# Patient Record
Sex: Female | Born: 1985 | Race: Black or African American | Hispanic: No | Marital: Single | Smoking: Never smoker
Health system: Southern US, Community
[De-identification: ages and names within clinical notes are randomized; demographics above are authoritative.]

## PROBLEM LIST (undated history)

## (undated) ENCOUNTER — Inpatient Hospital Stay: Payer: Self-pay

## (undated) ENCOUNTER — Inpatient Hospital Stay: Admission: RE | Payer: Self-pay | Source: Ambulatory Visit

## (undated) DIAGNOSIS — I10 Essential (primary) hypertension: Secondary | ICD-10-CM

## (undated) DIAGNOSIS — I82409 Acute embolism and thrombosis of unspecified deep veins of unspecified lower extremity: Secondary | ICD-10-CM

## (undated) DIAGNOSIS — B009 Herpesviral infection, unspecified: Secondary | ICD-10-CM

## (undated) DIAGNOSIS — A6000 Herpesviral infection of urogenital system, unspecified: Secondary | ICD-10-CM

## (undated) DIAGNOSIS — O99210 Obesity complicating pregnancy, unspecified trimester: Secondary | ICD-10-CM

## (undated) HISTORY — DX: Acute embolism and thrombosis of unspecified deep veins of unspecified lower extremity: I82.409

---
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
## 2012-05-03 DIAGNOSIS — I10 Essential (primary) hypertension: Secondary | ICD-10-CM

## 2012-05-03 HISTORY — DX: Essential (primary) hypertension: I10

## 2014-05-03 NOTE — L&D Delivery Note (Signed)
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 After cervical ripening with cervadil, pitocin was titrated until adequate contraction pattern was noted. Pt entered the second stage of labor and began to push over an intact perineum.  At 1933 a viable and healthy female "Mary Sherman" was delivered via NSVD in a cephalic position, direct OA. A once-looped nuchal cord was clamped and cut x2 at the perineum before delivery of the fetal body. The body did deliver immediately after and a vigorous infant was placed on the maternal abdomen.  APGAR: 7, 9 and 9 at 1, 5 and 10 minutes respectively. Baby weight not performed at time of dictation.   Placenta status delivered spontaneous promptly after delivery and was intact. Cord: 3 vessels.  Fundus was firm, minimal bleeding. Postpartum pitocin started in the third stage. Anesthesia: None  Episiotomy:  none Lacerations:  1st degree Suture Repair: 3.0 vicryl Est. Blood Loss (mL):  250  Mom to postpartum.  Baby to special care nursery at 10 minutes for grunting and retractions.  Benjaman Kindler 01/15/2015, 7:51 PM

## 2014-06-05 LAB — OB RESULTS CONSOLE RPR: RPR: NONREACTIVE

## 2014-06-05 LAB — OB RESULTS CONSOLE HIV ANTIBODY (ROUTINE TESTING): HIV: NONREACTIVE

## 2014-06-05 LAB — OB RESULTS CONSOLE RUBELLA ANTIBODY, IGM: RUBELLA: IMMUNE

## 2014-06-05 LAB — OB RESULTS CONSOLE HEPATITIS B SURFACE ANTIGEN: HEP B S AG: NEGATIVE

## 2014-06-05 LAB — OB RESULTS CONSOLE VARICELLA ZOSTER ANTIBODY, IGG: Varicella: IMMUNE

## 2014-08-27 ENCOUNTER — Ambulatory Visit
Admit: 2014-08-27 | Disposition: A | Discharge status: Home / Self Care | Payer: Self-pay | Attending: Family Medicine | Admitting: Family Medicine

## 2014-11-11 ENCOUNTER — Other Ambulatory Visit: Payer: Self-pay | Admitting: Family Medicine

## 2014-11-14 ENCOUNTER — Other Ambulatory Visit: Payer: Self-pay | Admitting: Family Medicine

## 2014-11-14 DIAGNOSIS — O4420 Partial placenta previa NOS or without hemorrhage, unspecified trimester: Secondary | ICD-10-CM

## 2014-11-14 DIAGNOSIS — Z3689 Encounter for other specified antenatal screening: Secondary | ICD-10-CM

## 2014-11-15 ENCOUNTER — Inpatient Hospital Stay
Admission: EM | Admit: 2014-11-15 | Discharge: 2014-11-15 | Disposition: A | Discharge status: Home / Self Care | Payer: Medicaid Other | Attending: Obstetrics and Gynecology | Admitting: Obstetrics and Gynecology

## 2014-11-15 ENCOUNTER — Encounter: Payer: Self-pay | Admitting: *Deleted

## 2014-11-15 DIAGNOSIS — Z3A32 32 weeks gestation of pregnancy: Secondary | ICD-10-CM | POA: Insufficient documentation

## 2014-11-15 DIAGNOSIS — N939 Abnormal uterine and vaginal bleeding, unspecified: Secondary | ICD-10-CM | POA: Diagnosis not present

## 2014-11-15 DIAGNOSIS — O4693 Antepartum hemorrhage, unspecified, third trimester: Secondary | ICD-10-CM | POA: Diagnosis not present

## 2014-11-15 HISTORY — DX: Herpesviral infection, unspecified: B00.9

## 2014-11-15 NOTE — OB Triage Note (Signed)
Abdominal pain starting at 1300. Sharp, intermittent pain 9/10. No leaking fluid. Spotting when up to the bathroom. Normal fetal movement reported.

## 2014-11-15 NOTE — H&P (Signed)
Mary Sherman is a 29 y.o. female presenting for vag bleeding while wiping today Maternal Medical History:  Reason for admission: Vaginal bleeding.   Contractions: Onset was 1-2 hours ago.   Frequency: rare.    Fetal activity: Perceived fetal activity is normal.   Last perceived fetal movement was within the past hour.    Prenatal complications: Bleeding.   No cholelithiasis, HIV, PIH, infection, IUGR, nephrolithiasis, oligohydramnios, placental abnormality, polyhydramnios, pre-eclampsia, preterm labor, substance abuse, thrombocytopenia or thrombophilia.     OB History    Gravida Para Term Preterm AB TAB SAB Ectopic Multiple Living   5 1 1  3  1   1      Past Medical History  Diagnosis Date  . HSV (herpes simplex virus) infection   . HSV (herpes simplex virus) infection    Past Surgical History  Procedure Laterality Date  . Cholecystectomy  2007   Family History: family history is not on file. Social History:  reports that she has never smoked. She has never used smokeless tobacco. She reports that she uses illicit drugs (Marijuana). She reports that she does not drink alcohol.   Prenatal Transfer Tool  Maternal Diabetes: No Genetic Screening: Normal Maternal Ultrasounds/Referrals: Normal Fetal Ultrasounds or other Referrals:  None Maternal Substance Abuse:  No Significant Maternal Medications:  None Significant Maternal Lab Results:  None Other Comments:  Low lying placenta  ROS Review of Systems - Negative except vaginal bleeding today   VSSExam Physical Exam  Abd: 1 rare UC noted Ext Gen: no lesions, no edema Vag: Vag exam done and no bleeding seen on exam. Cx: closed/post/?part FHR: Reactive NST with 2 accels 15 x 15 BPM A: IUP at 32 3/7 weeks  Prenatal labs: ABO, Rh:  A pos Antibody:   Neg Rubella:  Immune RPR:   NR HBsAg:   Neg HIV:   not listed GBS:   not done  Assessment/Plan: A: IUP at 32 3/7 weeks 2. Low lying placenta with c/o VB  today P: DC home on pelvic rest. 2. FU as scheduled or return for any problems.  Mary Sherman W 11/15/2014, 6:18 PM

## 2014-11-15 NOTE — Progress Notes (Signed)
Reviewed discharge instructions with patient including signs of labor. Verbalizes understanding. Ambulatory on discharge.

## 2014-11-28 ENCOUNTER — Ambulatory Visit
Admission: RE | Admit: 2014-11-28 | Discharge: 2014-11-28 | Disposition: A | Discharge status: Home / Self Care | Payer: Medicaid Other | Source: Ambulatory Visit | Attending: Family Medicine | Admitting: Family Medicine

## 2014-11-28 DIAGNOSIS — O4440 Low lying placenta NOS or without hemorrhage, unspecified trimester: Secondary | ICD-10-CM

## 2014-11-28 DIAGNOSIS — Z3A33 33 weeks gestation of pregnancy: Secondary | ICD-10-CM | POA: Diagnosis not present

## 2014-11-28 DIAGNOSIS — O99213 Obesity complicating pregnancy, third trimester: Secondary | ICD-10-CM | POA: Insufficient documentation

## 2014-11-28 DIAGNOSIS — Z3689 Encounter for other specified antenatal screening: Secondary | ICD-10-CM

## 2014-11-28 DIAGNOSIS — Z6841 Body Mass Index (BMI) 40.0 and over, adult: Secondary | ICD-10-CM | POA: Diagnosis not present

## 2014-11-28 DIAGNOSIS — F4321 Adjustment disorder with depressed mood: Secondary | ICD-10-CM | POA: Diagnosis present

## 2014-11-28 DIAGNOSIS — O4420 Partial placenta previa NOS or without hemorrhage, unspecified trimester: Secondary | ICD-10-CM

## 2014-11-28 HISTORY — DX: Essential (primary) hypertension: I10

## 2014-11-28 HISTORY — DX: Obesity complicating pregnancy, unspecified trimester: O99.210

## 2014-11-28 LAB — US MFM OB TRANSVAGINAL

## 2014-11-28 LAB — US OB DETAIL + 14 WK

## 2014-12-16 ENCOUNTER — Other Ambulatory Visit: Payer: Self-pay

## 2014-12-16 DIAGNOSIS — E669 Obesity, unspecified: Secondary | ICD-10-CM

## 2014-12-19 ENCOUNTER — Ambulatory Visit
Admission: RE | Admit: 2014-12-19 | Discharge: 2014-12-19 | Disposition: A | Discharge status: Home / Self Care | Payer: Medicaid Other | Source: Ambulatory Visit

## 2014-12-19 ENCOUNTER — Ambulatory Visit
Admission: RE | Admit: 2014-12-19 | Discharge: 2014-12-19 | Disposition: A | Discharge status: Home / Self Care | Payer: Medicaid Other | Source: Ambulatory Visit | Attending: Maternal & Fetal Medicine | Admitting: Maternal & Fetal Medicine

## 2014-12-19 ENCOUNTER — Other Ambulatory Visit: Payer: Self-pay | Admitting: Maternal & Fetal Medicine

## 2014-12-19 DIAGNOSIS — O99213 Obesity complicating pregnancy, third trimester: Secondary | ICD-10-CM | POA: Insufficient documentation

## 2014-12-19 DIAGNOSIS — Z3A36 36 weeks gestation of pregnancy: Secondary | ICD-10-CM | POA: Insufficient documentation

## 2014-12-19 DIAGNOSIS — E669 Obesity, unspecified: Secondary | ICD-10-CM

## 2014-12-19 LAB — OB RESULTS CONSOLE GBS: GBS: NEGATIVE

## 2014-12-19 LAB — OB RESULTS CONSOLE GC/CHLAMYDIA
Chlamydia: NEGATIVE
Gonorrhea: NEGATIVE

## 2014-12-26 ENCOUNTER — Other Ambulatory Visit: Payer: Self-pay

## 2014-12-26 ENCOUNTER — Inpatient Hospital Stay: Admission: RE | Admit: 2014-12-26 | Payer: Medicaid Other | Source: Ambulatory Visit

## 2014-12-26 ENCOUNTER — Ambulatory Visit
Admission: RE | Admit: 2014-12-26 | Discharge: 2014-12-26 | Disposition: A | Discharge status: Home / Self Care | Payer: Medicaid Other | Source: Ambulatory Visit | Attending: Obstetrics and Gynecology | Admitting: Obstetrics and Gynecology

## 2014-12-26 VITALS — BP 114/54 | HR 82 | Temp 98.1°F | Resp 18 | Ht 64.8 in | Wt 324.6 lb

## 2014-12-26 DIAGNOSIS — E669 Obesity, unspecified: Secondary | ICD-10-CM | POA: Insufficient documentation

## 2014-12-26 DIAGNOSIS — O99210 Obesity complicating pregnancy, unspecified trimester: Secondary | ICD-10-CM

## 2014-12-26 DIAGNOSIS — O99213 Obesity complicating pregnancy, third trimester: Secondary | ICD-10-CM | POA: Diagnosis present

## 2014-12-26 DIAGNOSIS — Z3A37 37 weeks gestation of pregnancy: Secondary | ICD-10-CM | POA: Diagnosis not present

## 2014-12-27 ENCOUNTER — Observation Stay
Admission: EM | Admit: 2014-12-27 | Discharge: 2014-12-28 | Disposition: A | Discharge status: Home / Self Care | Payer: Medicaid Other | Attending: Obstetrics and Gynecology | Admitting: Obstetrics and Gynecology

## 2014-12-27 DIAGNOSIS — D573 Sickle-cell trait: Secondary | ICD-10-CM | POA: Insufficient documentation

## 2014-12-27 DIAGNOSIS — O26899 Other specified pregnancy related conditions, unspecified trimester: Secondary | ICD-10-CM

## 2014-12-27 DIAGNOSIS — Z3A37 37 weeks gestation of pregnancy: Secondary | ICD-10-CM | POA: Insufficient documentation

## 2014-12-27 DIAGNOSIS — O10913 Unspecified pre-existing hypertension complicating pregnancy, third trimester: Secondary | ICD-10-CM | POA: Diagnosis not present

## 2014-12-27 DIAGNOSIS — R109 Unspecified abdominal pain: Secondary | ICD-10-CM

## 2014-12-27 DIAGNOSIS — O99213 Obesity complicating pregnancy, third trimester: Secondary | ICD-10-CM | POA: Insufficient documentation

## 2014-12-27 NOTE — H&P (Signed)
[id number] with [place] at [facility] with [place] of LMP of 04/10/2014 & EDD of 01/15/2015 with Morbid Obesity, HSV  Hx on prophylaxis, Essential HTN, Sickle cell trait,  Here tonight to [facility] for c/o "UC's that are becoming uncomfortable", No ROM, VB or decreased FM. Pt is 37 2/7 weeks today. PMH: HTN, HSV, Morbid obesity, Sickle cell trait (Partner has genital HSV) PSH: Cholestetectomy Will: Mom: HTN, DM in mat relatives Social: Left the FOB due to infidelity, +MJ usage, Medication or ETOH usage after LMP  MKL:KJZPHX x 9 Gen: 29 yo black female in NAD, A&O x 3 Heart: S1S2, RRR, No M/R/G Lungs: CTA bilat, No W/R/R. Abd: Gravid,  Extrems:1+/0 FHR: 150, reactive NST with 2 accels 15 x 15 BPM Cx: closed/long/unable to palpate presenting part A: IUP at 37 2/7 weeks P: Urine prot/creat ratio WNL PIH panel WNL P: DC home FU next week for BP check.

## 2014-12-27 NOTE — OB Triage Note (Signed)
Patient complaining of contractions since 1830

## 2014-12-28 LAB — CBC WITH DIFFERENTIAL/PLATELET
Basophils Absolute: 0 10*3/uL (ref 0–0.1)
Basophils Relative: 0 %
EOS PCT: 1 %
Eosinophils Absolute: 0.1 10*3/uL (ref 0–0.7)
HCT: 33.9 % — ABNORMAL LOW (ref 35.0–47.0)
Hemoglobin: 11.4 g/dL — ABNORMAL LOW (ref 12.0–16.0)
LYMPHS ABS: 1.9 10*3/uL (ref 1.0–3.6)
Lymphocytes Relative: 23 %
MCH: 27.9 pg (ref 26.0–34.0)
MCHC: 33.6 g/dL (ref 32.0–36.0)
MCV: 83.2 fL (ref 80.0–100.0)
MONO ABS: 0.5 10*3/uL (ref 0.2–0.9)
Monocytes Relative: 6 %
Neutro Abs: 5.7 10*3/uL (ref 1.4–6.5)
Neutrophils Relative %: 70 %
PLATELETS: 207 10*3/uL (ref 150–440)
RBC: 4.07 MIL/uL (ref 3.80–5.20)
RDW: 15.3 % — AB (ref 11.5–14.5)
WBC: 8.1 10*3/uL (ref 3.6–11.0)

## 2014-12-28 LAB — PROTEIN / CREATININE RATIO, URINE
CREATININE, URINE: 113 mg/dL
Protein Creatinine Ratio: 0.07 mg/mg{Cre} (ref 0.00–0.15)
Total Protein, Urine: 8 mg/dL

## 2014-12-28 LAB — URIC ACID: URIC ACID, SERUM: 5 mg/dL (ref 2.3–6.6)

## 2014-12-28 NOTE — Discharge Instructions (Signed)

## 2015-01-02 ENCOUNTER — Other Ambulatory Visit: Payer: Self-pay

## 2015-01-02 ENCOUNTER — Ambulatory Visit
Admission: RE | Admit: 2015-01-02 | Discharge: 2015-01-02 | Disposition: A | Discharge status: Home / Self Care | Payer: Medicaid Other | Source: Ambulatory Visit | Attending: Internal Medicine | Admitting: Internal Medicine

## 2015-01-02 DIAGNOSIS — O99210 Obesity complicating pregnancy, unspecified trimester: Secondary | ICD-10-CM

## 2015-01-02 DIAGNOSIS — O99213 Obesity complicating pregnancy, third trimester: Secondary | ICD-10-CM | POA: Insufficient documentation

## 2015-01-02 DIAGNOSIS — E669 Obesity, unspecified: Secondary | ICD-10-CM | POA: Diagnosis not present

## 2015-01-02 DIAGNOSIS — Z3A38 38 weeks gestation of pregnancy: Secondary | ICD-10-CM | POA: Diagnosis not present

## 2015-01-02 DIAGNOSIS — Z6841 Body Mass Index (BMI) 40.0 and over, adult: Secondary | ICD-10-CM | POA: Insufficient documentation

## 2015-01-09 ENCOUNTER — Ambulatory Visit
Admission: RE | Admit: 2015-01-09 | Discharge: 2015-01-09 | Disposition: A | Discharge status: Home / Self Care | Payer: Medicaid Other | Source: Ambulatory Visit | Attending: Obstetrics and Gynecology | Admitting: Obstetrics and Gynecology

## 2015-01-09 DIAGNOSIS — O99213 Obesity complicating pregnancy, third trimester: Secondary | ICD-10-CM | POA: Diagnosis not present

## 2015-01-09 DIAGNOSIS — O99210 Obesity complicating pregnancy, unspecified trimester: Secondary | ICD-10-CM

## 2015-01-09 DIAGNOSIS — Z3A39 39 weeks gestation of pregnancy: Secondary | ICD-10-CM | POA: Diagnosis not present

## 2015-01-14 ENCOUNTER — Encounter: Payer: Self-pay | Admitting: Anesthesiology

## 2015-01-14 ENCOUNTER — Inpatient Hospital Stay
Admission: RE | Admit: 2015-01-14 | Discharge: 2015-01-17 | DRG: 774 | Disposition: A | Discharge status: Home / Self Care | Payer: Medicaid Other | Source: Ambulatory Visit | Attending: Obstetrics and Gynecology | Admitting: Obstetrics and Gynecology

## 2015-01-14 ENCOUNTER — Encounter: Payer: Self-pay | Admitting: *Deleted

## 2015-01-14 DIAGNOSIS — Z3A40 40 weeks gestation of pregnancy: Secondary | ICD-10-CM | POA: Diagnosis present

## 2015-01-14 DIAGNOSIS — O99210 Obesity complicating pregnancy, unspecified trimester: Secondary | ICD-10-CM | POA: Diagnosis present

## 2015-01-14 DIAGNOSIS — O99214 Obesity complicating childbirth: Secondary | ICD-10-CM | POA: Diagnosis present

## 2015-01-14 DIAGNOSIS — O9852 Other viral diseases complicating childbirth: Secondary | ICD-10-CM | POA: Diagnosis present

## 2015-01-14 DIAGNOSIS — A6000 Herpesviral infection of urogenital system, unspecified: Secondary | ICD-10-CM | POA: Diagnosis present

## 2015-01-14 DIAGNOSIS — O480 Post-term pregnancy: Secondary | ICD-10-CM | POA: Diagnosis present

## 2015-01-14 DIAGNOSIS — B009 Herpesviral infection, unspecified: Secondary | ICD-10-CM | POA: Diagnosis present

## 2015-01-14 DIAGNOSIS — O133 Gestational [pregnancy-induced] hypertension without significant proteinuria, third trimester: Principal | ICD-10-CM | POA: Diagnosis present

## 2015-01-14 DIAGNOSIS — Z6841 Body Mass Index (BMI) 40.0 and over, adult: Secondary | ICD-10-CM

## 2015-01-14 DIAGNOSIS — O163 Unspecified maternal hypertension, third trimester: Secondary | ICD-10-CM | POA: Diagnosis present

## 2015-01-14 LAB — CBC
HCT: 34.3 % — ABNORMAL LOW (ref 35.0–47.0)
Hemoglobin: 11.6 g/dL — ABNORMAL LOW (ref 12.0–16.0)
MCH: 28.1 pg (ref 26.0–34.0)
MCHC: 33.8 g/dL (ref 32.0–36.0)
MCV: 82.9 fL (ref 80.0–100.0)
Platelets: 216 10*3/uL (ref 150–440)
RBC: 4.14 MIL/uL (ref 3.80–5.20)
RDW: 15.6 % — AB (ref 11.5–14.5)
WBC: 8.2 10*3/uL (ref 3.6–11.0)

## 2015-01-14 LAB — TYPE AND SCREEN
ABO/RH(D): A POS
ANTIBODY SCREEN: NEGATIVE

## 2015-01-14 LAB — ABO/RH: ABO/RH(D): A POS

## 2015-01-14 MED ORDER — LABETALOL HCL 5 MG/ML IV SOLN
10.0000 mg | Freq: Once | INTRAVENOUS | Status: AC
  Administered 2015-01-14: 20 mg via INTRAVENOUS

## 2015-01-14 MED ORDER — OXYTOCIN 40 UNITS IN LACTATED RINGERS INFUSION - SIMPLE MED
INTRAVENOUS | Status: AC
  Filled 2015-01-14: qty 1000

## 2015-01-14 MED ORDER — ACETAMINOPHEN 325 MG PO TABS: 650.0000 mg | ORAL_TABLET | ORAL | Status: DC | PRN

## 2015-01-14 MED ORDER — AMMONIA AROMATIC IN INHA
RESPIRATORY_TRACT | Status: AC
  Filled 2015-01-14: qty 10

## 2015-01-14 MED ORDER — LACTATED RINGERS IV SOLN
INTRAVENOUS | Status: DC
  Administered 2015-01-14 – 2015-01-15 (×3): 1000 mL via INTRAVENOUS
  Administered 2015-01-15: 125 mL/h via INTRAVENOUS
  Administered 2015-01-15: 10:00:00 via INTRAVENOUS

## 2015-01-14 MED ORDER — MORPHINE SULFATE (PF) 10 MG/ML IV SOLN
15.0000 mg | Freq: Once | INTRAVENOUS | Status: AC
  Administered 2015-01-14: 15 mg via INTRAMUSCULAR
  Filled 2015-01-14: qty 2

## 2015-01-14 MED ORDER — MISOPROSTOL 200 MCG PO TABS
ORAL_TABLET | ORAL | Status: AC
  Filled 2015-01-14: qty 4

## 2015-01-14 MED ORDER — DINOPROSTONE 10 MG VA INST
10.0000 mg | VAGINAL_INSERT | Freq: Once | VAGINAL | Status: AC
  Administered 2015-01-14: 10 mg via VAGINAL
  Filled 2015-01-14: qty 1

## 2015-01-14 MED ORDER — OXYTOCIN 10 UNIT/ML IJ SOLN
INTRAMUSCULAR | Status: AC
  Filled 2015-01-14: qty 2

## 2015-01-14 MED ORDER — ONDANSETRON HCL 4 MG/2ML IJ SOLN: 4.0000 mg | Freq: Four times a day (QID) | INTRAMUSCULAR | Status: DC | PRN

## 2015-01-14 MED ORDER — LIDOCAINE HCL (PF) 1 % IJ SOLN
30.0000 mL | INTRAMUSCULAR | Status: DC | PRN
  Filled 2015-01-14: qty 30

## 2015-01-14 MED ORDER — LIDOCAINE HCL (PF) 1 % IJ SOLN
INTRAMUSCULAR | Status: AC
  Filled 2015-01-14: qty 30

## 2015-01-14 MED ORDER — BUTORPHANOL TARTRATE 1 MG/ML IJ SOLN
1.0000 mg | INTRAMUSCULAR | Status: DC | PRN
Start: 2015-01-14 — End: 2015-01-15
  Administered 2015-01-14 – 2015-01-15 (×3): 1 mg via INTRAVENOUS
  Filled 2015-01-14 (×3): qty 1

## 2015-01-14 MED ORDER — LABETALOL HCL 5 MG/ML IV SOLN
INTRAVENOUS | Status: AC
  Administered 2015-01-14: 20 mg via INTRAVENOUS
  Filled 2015-01-14: qty 4

## 2015-01-14 NOTE — H&P (Signed)
Mary Sherman is a 29 y.o. female presenting for induction of labor for gestational htn and obesity .She has a h/o genital HSV and has not had an outbreak for 2 yrs .she has been on acyclovir prophylaxis for 4 weeks according to her  . History OB History    Gravida Para Term Preterm AB TAB SAB Ectopic Multiple Living   5 1 1  3  1   1      Past Medical History  Diagnosis Date  . HSV (herpes simplex virus) infection   . Obesity affecting pregnancy   . Hypertension 2014   Past Surgical History  Procedure Laterality Date  . Cholecystectomy  2007   Family History: family history is not on file. Social History:  reports that she has never smoked. She has never used smokeless tobacco. She reports that she uses illicit drugs (Marijuana). She reports that she does not drink alcohol.   Prenatal Transfer Tool  Maternal Diabetes: No Genetic Screening: Normal Maternal Ultrasounds/Referrals: Normal Fetal Ultrasounds or other Referrals:  None Maternal Substance Abuse:  No Significant Maternal Medications:   acyclovir Significant Maternal Lab Results:  None Other Comments:  None  ROS  Dilation: Fingertip Station: Ballotable Exam by:: tjs Blood pressure 132/58, pulse 77, temperature 98.2 F (36.8 C), temperature source Oral, resp. rate 18, height 5\' 4"  (1.626 m), weight 146.965 kg (324 lb). Exam Physical Exam  VSS: 132/ 58  Lungs CTA  CV RRR  adb soft NT  cx closed / 25%/ -3/ vtx: cervidil placed 0940  External genitalia : no lesions c/w HSV Reassuring fetal monitoring . Irregular ctx   Prenatal labs: ABO, Rh: --/--/A POS (09/13 4825) Antibody: NEG (09/13 0956) Rubella:   RPR:    HBsAg:    HIV:    GBS:   negative GBS  Assessment/Plan: Gestational HTN  Obesity  Cervidil induction    SCHERMERHORN,THOMAS 01/14/2015, 1:47 PM

## 2015-01-14 NOTE — Progress Notes (Addendum)
Patient ID: Mary Sherman, female   DOB: Feb 22, 1986, 29 y.o.   MRN: [id number] CTX q 1-2 min. Pt breathing through ctx. Cervidil pulled out . FHR reassuring cx : 1cm / 40% / -2  A; early latent labor  P: small meal  15 mg morphine sulfate IM  , may repeat in 6 hrs . Reeval in am      BP elevated , probably related to pain  PIH labs ordered  LAbetalol to keep SBP<160 + DBP<100

## 2015-01-15 LAB — RPR: RPR Ser Ql: NONREACTIVE

## 2015-01-15 MED ORDER — SIMETHICONE 80 MG PO CHEW: 80.0000 mg | CHEWABLE_TABLET | ORAL | Status: DC | PRN

## 2015-01-15 MED ORDER — FLEET ENEMA 7-19 GM/118ML RE ENEM: 1.0000 | ENEMA | Freq: Every day | RECTAL | Status: DC | PRN

## 2015-01-15 MED ORDER — DIPHENHYDRAMINE HCL 25 MG PO CAPS: 25.0000 mg | ORAL_CAPSULE | Freq: Four times a day (QID) | ORAL | Status: DC | PRN

## 2015-01-15 MED ORDER — ONDANSETRON HCL 4 MG PO TABS: 4.0000 mg | ORAL_TABLET | ORAL | Status: DC | PRN

## 2015-01-15 MED ORDER — ONDANSETRON HCL 4 MG/2ML IJ SOLN: 4.0000 mg | INTRAMUSCULAR | Status: DC | PRN

## 2015-01-15 MED ORDER — TETANUS-DIPHTH-ACELL PERTUSSIS 5-2.5-18.5 LF-MCG/0.5 IM SUSP: 0.5000 mL | Freq: Once | INTRAMUSCULAR | Status: DC

## 2015-01-15 MED ORDER — SODIUM CHLORIDE 0.9 % IV SOLN: 250.0000 mL | INTRAVENOUS | Status: DC | PRN

## 2015-01-15 MED ORDER — SENNOSIDES-DOCUSATE SODIUM 8.6-50 MG PO TABS
2.0000 | ORAL_TABLET | ORAL | Status: DC
  Administered 2015-01-16 – 2015-01-17 (×2): 2 via ORAL
  Filled 2015-01-15 (×2): qty 2

## 2015-01-15 MED ORDER — OXYTOCIN 40 UNITS IN LACTATED RINGERS INFUSION - SIMPLE MED
1.0000 m[IU]/min | INTRAVENOUS | Status: DC
  Administered 2015-01-15: 1 m[IU]/min via INTRAVENOUS
  Filled 2015-01-15: qty 1000

## 2015-01-15 MED ORDER — MORPHINE SULFATE (PF) 10 MG/ML IV SOLN
15.0000 mg | Freq: Once | INTRAVENOUS | Status: AC
  Administered 2015-01-15: 15 mg via INTRAMUSCULAR
  Filled 2015-01-15: qty 2

## 2015-01-15 MED ORDER — BUTORPHANOL TARTRATE 1 MG/ML IJ SOLN
1.0000 mg | INTRAMUSCULAR | Status: DC | PRN
  Administered 2015-01-15 (×2): 2 mg via INTRAVENOUS
  Filled 2015-01-15 (×2): qty 2

## 2015-01-15 MED ORDER — OXYTOCIN 40 UNITS IN LACTATED RINGERS INFUSION - SIMPLE MED: 62.5000 mL/h | INTRAVENOUS | Status: DC | PRN

## 2015-01-15 MED ORDER — HYDRALAZINE HCL 20 MG/ML IJ SOLN: 10.0000 mg | Freq: Once | INTRAMUSCULAR | Status: DC | PRN

## 2015-01-15 MED ORDER — OXYCODONE-ACETAMINOPHEN 5-325 MG PO TABS: 2.0000 | ORAL_TABLET | ORAL | Status: DC | PRN

## 2015-01-15 MED ORDER — IBUPROFEN 600 MG PO TABS
ORAL_TABLET | ORAL | Status: AC
  Administered 2015-01-15: 600 mg
  Filled 2015-01-15: qty 1

## 2015-01-15 MED ORDER — LABETALOL HCL 5 MG/ML IV SOLN
20.0000 mg | INTRAVENOUS | Status: DC | PRN
Start: 2015-01-15 — End: 2015-01-15

## 2015-01-15 MED ORDER — PRENATAL MULTIVITAMIN CH
1.0000 | ORAL_TABLET | Freq: Every day | ORAL | Status: DC
  Administered 2015-01-16 – 2015-01-17 (×2): 1 via ORAL
  Filled 2015-01-15 (×2): qty 1

## 2015-01-15 MED ORDER — MEASLES, MUMPS & RUBELLA VAC ~~LOC~~ INJ: 0.5000 mL | INJECTION | Freq: Once | SUBCUTANEOUS | Status: DC

## 2015-01-15 MED ORDER — ZOLPIDEM TARTRATE 5 MG PO TABS: 5.0000 mg | ORAL_TABLET | Freq: Every evening | ORAL | Status: DC | PRN

## 2015-01-15 MED ORDER — IBUPROFEN 600 MG PO TABS
600.0000 mg | ORAL_TABLET | Freq: Four times a day (QID) | ORAL | Status: DC
  Administered 2015-01-15 – 2015-01-17 (×4): 600 mg via ORAL
  Filled 2015-01-15 (×5): qty 1

## 2015-01-15 MED ORDER — BENZOCAINE-MENTHOL 20-0.5 % EX AERO: 1.0000 "application " | INHALATION_SPRAY | CUTANEOUS | Status: DC | PRN

## 2015-01-15 MED ORDER — ACETAMINOPHEN 325 MG PO TABS: 650.0000 mg | ORAL_TABLET | ORAL | Status: DC | PRN

## 2015-01-15 MED ORDER — BENZOCAINE-MENTHOL 20-0.5 % EX AERO
INHALATION_SPRAY | CUTANEOUS | Status: AC
  Administered 2015-01-15: 21:00:00
  Filled 2015-01-15: qty 56

## 2015-01-15 MED ORDER — WITCH HAZEL-GLYCERIN EX PADS: 1.0000 "application " | MEDICATED_PAD | CUTANEOUS | Status: DC | PRN

## 2015-01-15 MED ORDER — SODIUM CHLORIDE 0.9 % IJ SOLN: 3.0000 mL | INTRAMUSCULAR | Status: DC | PRN

## 2015-01-15 MED ORDER — DIBUCAINE 1 % RE OINT: 1.0000 "application " | TOPICAL_OINTMENT | RECTAL | Status: DC | PRN

## 2015-01-15 MED ORDER — TERBUTALINE SULFATE 1 MG/ML IJ SOLN: 0.2500 mg | Freq: Once | INTRAMUSCULAR | Status: DC | PRN

## 2015-01-15 MED ORDER — LANOLIN HYDROUS EX OINT: TOPICAL_OINTMENT | CUTANEOUS | Status: DC | PRN

## 2015-01-15 MED ORDER — BISACODYL 10 MG RE SUPP: 10.0000 mg | Freq: Every day | RECTAL | Status: DC | PRN

## 2015-01-15 MED ORDER — OXYCODONE-ACETAMINOPHEN 5-325 MG PO TABS: 1.0000 | ORAL_TABLET | ORAL | Status: DC | PRN

## 2015-01-15 MED ORDER — SODIUM CHLORIDE 0.9 % IJ SOLN
3.0000 mL | Freq: Two times a day (BID) | INTRAMUSCULAR | Status: DC
Start: 2015-01-15 — End: 2015-01-16

## 2015-01-15 NOTE — Progress Notes (Signed)
Patient ID: Mary Sherman, female   DOB: 1985/10/25, 29 y.o.   MRN: [id number]  Day #2 of induction for HTN. Cervix 2/50/high and anterior. Will begin pitocin, as is contracting on monitor q3-5 min.  Monitor BP- labetolol PRN severe range.

## 2015-01-15 NOTE — Plan of Care (Signed)
Dr. Leafy Ro in room for delivery. Patient repositioned; pushing very well. Tight nuchal cord, cut and infant to warmer. Deanna Artis, RN

## 2015-01-15 NOTE — Progress Notes (Signed)
SHARLISA HOLLIFIELD is a 29 y.o. [phone] at [redacted]w[redacted]d admitted for iol for gHTN and obesity.  Subjective: Contracting effectively, feeling urge to push.  Objective: BP 141/96 mmHg  Pulse 71  Temp(Src) 97.7 F (36.5 C) (Oral)  Resp 18  Ht 5\' 4"  (1.626 m)  Wt 146.965 kg (324 lb)  BMI 55.59 kg/m2 I/O last 3 completed shifts: In: 918.8 [I.V.:918.8] Out: -     FHT:  FHR: 145 bpm, variability: moderate,  accelerations:  Present,  decelerations:  Absent UC:   regular, every 3 minutes SVE:   Dilation:  9.5 Effacement (%): 90 Station: 0 Exam by:: gck  Labs: Lab Results  Component Value Date   WBC 8.2 01/14/2015   HGB 11.6* 01/14/2015   HCT 34.3* 01/14/2015   MCV 82.9 01/14/2015   PLT 216 01/14/2015    Assessment / Plan: Induction of labor due to gestational hypertension,  progressing well on pitocin  Labor: Progressing normally Fetal Wellbeing:  Category I Pain Control:  Labor support without medications I/D:  n/a Anticipated MOD:  NSVD   Trial push unable to reduce cervix. Continue labor. Continuous monitoring.  Benjaman Kindler 01/15/2015, 6:48 PM

## 2015-01-15 NOTE — Plan of Care (Signed)
Following IM injection of Morphine, patient sleeping hard, unable to move to readjust [place] to trace FHT's. Multiple attempts to move patient and/or reposition [place]. Deanna Artis, RN

## 2015-01-16 LAB — CBC
HCT: 35.1 % (ref 35.0–47.0)
Hemoglobin: 11.7 g/dL — ABNORMAL LOW (ref 12.0–16.0)
MCH: 27.9 pg (ref 26.0–34.0)
MCHC: 33.4 g/dL (ref 32.0–36.0)
MCV: 83.5 fL (ref 80.0–100.0)
PLATELETS: 208 10*3/uL (ref 150–440)
RBC: 4.2 MIL/uL (ref 3.80–5.20)
RDW: 15.1 % — AB (ref 11.5–14.5)
WBC: 13.9 10*3/uL — ABNORMAL HIGH (ref 3.6–11.0)

## 2015-01-16 NOTE — Progress Notes (Signed)
PPD #1, SVD, female "73"  S:  Reports feeling good, minimal rest overnight             Tolerating po/ No nausea or vomiting             Bleeding is light             Pain controlled withMotrin             Up ad lib / ambulatory / voiding QS  Newborn - bottle feeding    O:               VS: Blood pressure 144/79, pulse 74, temperature 98.2 F (36.8 C), temperature source Oral, resp. rate 21, height 5\' 4"  (1.626 m), weight 146.965 kg (324 lb), SpO2 100 %, unknown if currently breastfeeding.  LABS:              Recent Labs  01/14/15 0958 01/16/15 0634  WBC 8.2 13.9*  HGB 11.6* 11.7*  PLT 216 208               Blood type: --/--/A POS (09/13 0957)  Rubella: Immune (02/03 0000)                     I&O: Intake/Output      09/14 0701 - 09/15 0700 09/15 0701 - 09/16 0700   I.V. (mL/kg)     Total Intake(mL/kg)     Urine (mL/kg/hr) 400 (0.1) 1000 (6.5)   Total Output 400 1000   Net -400 -1000        Urine Occurrence  1 x                 Physical Exam:             Alert and oriented X3  Lungs: Clear and unlabored  Heart: regular rate and rhythm / no mumurs  Abdomen: soft, non-tender, non-distended              Fundus: firm, non-tender, U 1+  Perineum: well-approximated 1st degree lac - healing well, no erythema, no ecchymosis   Lochia: light, bright red, no clots noted  Extremities: +1 dependent edema in lower extremities, no calf pain or tenderness    A: PPD # 1   Doing well - stable status  Morbid Obesity  Chronic HTN - not on meds, BP's stable   P: Routine post partum orders  Encourage rest today   Empty bladder every 2-3 hours   Breast care reviewed for bottle feeding mothers reviewed  Anticipate discharge home tomorrow  Darliss Cheney, CNM

## 2015-01-17 LAB — SURGICAL PATHOLOGY

## 2015-01-17 NOTE — Progress Notes (Signed)
Patient discharged home with infant. Discharge instructions, prescriptions and follow up appointment given to and reviewed with patient. Patient verbalized understanding. Escorted out with infant by auxillary.

## 2015-01-17 NOTE — Discharge Instructions (Signed)
° ° ° ° °  Catheryn Bacon, CNM 01/17/2015 10:48 AM

## 2015-01-17 NOTE — Discharge Summary (Signed)
Obstetric Discharge Summary   Patient ID: Mary Sherman MRN: [id number] DOB/AGE: 27-07-1985 27 y.o.   Date of Admission: 01/14/2015  Date of Discharge: 01/17/15  Admitting Diagnosis: Induction of labor at [redacted]w[redacted]d  Secondary Diagnosis: Gestational hypertension  Mode of Delivery: normal spontaneous vaginal delivery     Discharge Diagnosis: Gestational hypertension   Intrapartum Procedures: IOL: see notes   Post partum procedures: none  Complications: none   Brief Hospital Course  Mary Sherman is a [id number] who had a SVD on 01/15/15;  for further details of this delivery, please refer to the delivery note.  Patient had an uncomplicated postpartum course.  By time of discharge on PPD#2her pain was controlled on oral pain medications; she had appropriate lochia and was ambulating, voiding without difficulty and tolerating regular diet.  She was deemed stable for discharge to home.    .  Patient had an uncomplicated postpartum course.  By time of discharge on PPD#2 her pain was controlled on oral pain medications; she had appropriate lochia and was ambulating, voiding without difficulty, tolerating regular diet and passing flatus.   She was deemed stable for discharge to home.    Labs: CBC Latest Ref Rng 01/16/2015 01/14/2015 12/28/2014  WBC 3.6 - 11.0 K/uL 13.9(H) 8.2 8.1  Hemoglobin 12.0 - 16.0 g/dL 11.7(L) 11.6(L) 11.4(L)  Hematocrit 35.0 - 47.0 % 35.1 34.3(L) 33.9(L)  Platelets 150 - 440 K/uL 208 216 207   A POS  Physical exam:  Blood pressure 148/86, pulse 77, temperature 98.7 F (37.1 C), temperature source Oral, resp. rate 18, height 5\' 4"  (1.626 m), weight 324 lb (146.965 kg), SpO2 100 %, unknown if currently breastfeeding. General: alert and no distress Lochia: appropriate Abdomen: soft, NT Uterine Fundus: firm Extremities: No evidence of DVT seen on physical exam. No lower extremity edema.  Discharge Instructions: Per After Visit Summary. Activity: Advance as  tolerated. Pelvic rest for 6 weeks.  Also refer to After Visit Summary Diet: Regular Medications:   Medication List    STOP taking these medications        acyclovir 400 MG tablet  Commonly known as:  ZOVIRAX     aspirin 81 MG tablet      TAKE these medications        prenatal multivitamin Tabs tablet  Take 1 tablet by mouth daily at 12 noon.       Outpatient follow up:  Postpartum contraception: oral contraceptives (estrogen/progesterone)  Discharged Condition: stable  Discharged to: home   Newborn Data:  Baby Boy named "Jabari"  Disposition:home with mother  Apgars: APGAR (1 MIN): 7   APGAR (5 MINS): 9   APGAR (10 MINS):    Baby Feeding: [place], CNM 01/17/2015

## 2015-03-20 ENCOUNTER — Emergency Department: Payer: Medicaid Other

## 2015-03-20 ENCOUNTER — Emergency Department
Admission: EM | Admit: 2015-03-20 | Discharge: 2015-03-20 | Disposition: A | Discharge status: Home / Self Care | Payer: Medicaid Other | Attending: Emergency Medicine | Admitting: Emergency Medicine

## 2015-03-20 ENCOUNTER — Encounter: Payer: Self-pay | Admitting: Emergency Medicine

## 2015-03-20 DIAGNOSIS — Z79899 Other long term (current) drug therapy: Secondary | ICD-10-CM | POA: Insufficient documentation

## 2015-03-20 DIAGNOSIS — M79604 Pain in right leg: Secondary | ICD-10-CM | POA: Diagnosis present

## 2015-03-20 DIAGNOSIS — I82401 Acute embolism and thrombosis of unspecified deep veins of right lower extremity: Secondary | ICD-10-CM

## 2015-03-20 DIAGNOSIS — I10 Essential (primary) hypertension: Secondary | ICD-10-CM | POA: Diagnosis not present

## 2015-03-20 MED ORDER — TRAMADOL HCL 50 MG PO TABS: 50.0000 mg | ORAL_TABLET | Freq: Four times a day (QID) | ORAL | Status: DC | PRN

## 2015-03-20 MED ORDER — RIVAROXABAN (XARELTO) VTE STARTER PACK (15 & 20 MG): ORAL_TABLET | ORAL | Status: DC

## 2015-03-20 NOTE — ED Provider Notes (Signed)
[facility] Emergency Department Provider Note ____________________________________________  Time seen: Approximately 9:04 AM  I have reviewed the triage vital signs and the nursing notes.   HISTORY  Chief Complaint Leg Pain   HPI Mary Sherman is a 29 y.o. female who presents to the emergency department for evaluation of right calf pain. She denies injury. She denies ever having had similar symptoms. She is a nonsmoker, she does take birth control pills, she denies history of PE or DVT. 2 months postpartum. Not breastfeeding.   Past Medical History  Diagnosis Date  . HSV (herpes simplex virus) infection   . Obesity affecting pregnancy   . Hypertension 2014    Patient Active Problem List   Diagnosis Date Noted  . Labor and delivery indication for care or intervention 01/14/2015  . Abdominal pain in pregnancy 12/27/2014  . Maternal obesity syndrome in third trimester 12/26/2014  . Vaginal bleeding 11/15/2014    Past Surgical History  Procedure Laterality Date  . Cholecystectomy  2007    Current Outpatient Rx  Name  Route  Sig  Dispense  Refill  . chlorothiazide (DIURIL) 250 MG tablet   Oral   Take 250 mg by mouth every morning.         . Prenatal Vit-Fe Fumarate-FA (PRENATAL MULTIVITAMIN) TABS tablet   Oral   Take 1 tablet by mouth daily at 12 noon.         . Rivaroxaban (XARELTO STARTER PACK) 15 & 20 MG TBPK      Take as directed on package: Start with one 15mg  tablet by mouth twice a day with food. On Day 22, switch to one 20mg  tablet once a day with food.   51 each   0   . traMADol (ULTRAM) 50 MG tablet   Oral   Take 1 tablet (50 mg total) by mouth every 6 (six) hours as needed.   9 tablet   0     Allergies Review of patient's allergies indicates no known allergies.  No family history on file.  Social History Social History  Substance Use Topics  . Smoking status: Never Smoker   . Smokeless tobacco: Never Used   . Alcohol Use: No    Review of Systems Constitutional: No recent illness. Eyes: No visual changes. ENT: No sore throat. Cardiovascular: Denies chest pain or palpitations. Respiratory: Denies shortness of breath. Gastrointestinal: No abdominal pain.  Genitourinary: Negative for dysuria. Musculoskeletal: Pain in right calf Skin: Negative for rash. Neurological: Negative for headaches, focal weakness or numbness. 10-point ROS otherwise negative.  ____________________________________________   PHYSICAL EXAM:  VITAL SIGNS: ED Triage Vitals  Enc Vitals Group     BP 03/20/15 0827 114/74 mmHg     Pulse Rate 03/20/15 0827 75     Resp 03/20/15 0827 20     Temp 03/20/15 0827 97.3 F (36.3 C)     Temp Source 03/20/15 0827 Oral     SpO2 03/20/15 0827 97 %     Weight 03/20/15 0827 324 lb (146.965 kg)     Height --      Head Cir --      Peak Flow --      Pain Score 03/20/15 0824 8     Pain Loc --      Pain Edu? --      Excl. in [place]? --     Constitutional: Alert and oriented. Well appearing and in no acute distress. Eyes: Conjunctivae are normal. EOMI. Head: Atraumatic.  Nose: No congestion/rhinnorhea. Neck: No stridor.  Respiratory: Normal respiratory effort.   Musculoskeletal: Pain elicited with palpation of posterior right calf. Homans sign is negative. Neurologic:  Normal speech and language. No gross focal neurologic deficits are appreciated. Speech is normal. No gait instability. Skin:  Skin is warm, dry and intact. Atraumatic. Psychiatric: Mood and affect are normal. Speech and behavior are normal.  ____________________________________________   LABS (all labs ordered are listed, but only abnormal results are displayed)  Labs Reviewed - No data to display ____________________________________________  RADIOLOGY  Probable occlusive thrombus in the right peroneal vein. ____________________________________________   PROCEDURES  Procedure(s) performed:  None   ____________________________________________   INITIAL IMPRESSION / ASSESSMENT AND PLAN / ED COURSE  Pertinent labs & imaging results that were available during my care of the patient were reviewed by me and considered in my medical decision making (see chart for details).  Xarelto started pack and tramadol Rx given. Patient will follow up with Dr. Clide Deutscher on Nov. 21, 2016 as already scheduled. ER return precautions were given. ____________________________________________   FINAL CLINICAL IMPRESSION(S) / ED DIAGNOSES  Final diagnoses:  Thromboembolism of deep veins of lower extremity, right [facility])       Victorino Dike, FNP 03/20/15 1126  Ahmed Prima, MD 03/20/15 [phone]

## 2015-03-20 NOTE — Discharge Instructions (Signed)
Deep Vein Thrombosis °A deep vein thrombosis (DVT) is a blood clot (thrombus) that usually occurs in a deep, larger vein of the lower leg or the pelvis, or in an upper extremity such as the arm. These are dangerous and can lead to serious and even life-threatening complications if the clot travels to the lungs. °A DVT can damage the valves in your leg veins so that instead of flowing upward, the blood pools in the lower leg. This is called post-thrombotic syndrome, and it can result in pain, swelling, discoloration, and sores on the leg. °CAUSES °A DVT is caused by the formation of a blood clot in your leg, pelvis, or arm. Usually, several things contribute to the formation of blood clots. A clot may develop when: °· Your blood flow slows down. °· Your vein becomes damaged in some way. °· You have a condition that makes your blood clot more easily. °RISK FACTORS °A DVT is more likely to develop in: °· People who are older, especially over 60 years of age. °· People who are overweight (obese). °· People who sit or lie still for a long time, such as during long-distance travel (over 4 hours), bed rest, hospitalization, or during recovery from certain medical conditions like a stroke. °· People who do not engage in much physical activity (sedentary lifestyle). °· People who have chronic breathing disorders. °· People who have a personal or family history of blood clots or blood clotting disease. °· People who have peripheral vascular disease (PVD), diabetes, or some types of cancer. °· People who have heart disease, especially if the person had a recent heart attack or has congestive heart failure. °· People who have neurological diseases that affect the legs (leg paresis). °· People who have had a traumatic injury, such as breaking a hip or leg. °· People who have recently had major or lengthy surgery, especially on the hip, knee, or abdomen. °· People who have had a central line placed inside a large vein. °· People  who take medicines that contain the hormone estrogen. These include birth control pills and hormone replacement therapy. °· Pregnancy or during childbirth or the postpartum period. °· Long plane flights (over 8 hours). °SIGNS AND SYMPTOMS °Symptoms of a DVT can include:  °· Swelling of your leg or arm, especially if one side is much worse. °· Warmth and redness of your leg or arm, especially if one side is much worse. °· Pain in your arm or leg. If the clot is in your leg, symptoms may be more noticeable or worse when you stand or walk. °· A feeling of pins and needles, if the clot is in the arm. °The symptoms of a DVT that has traveled to the lungs (pulmonary embolism, PE) usually start suddenly and include: °· Shortness of breath while active or at rest. °· Coughing or coughing up blood or blood-tinged mucus. °· Chest pain that is often worse with deep breaths. °· Rapid or irregular heartbeat. °· Feeling light-headed or dizzy. °· Fainting. °· Feeling anxious. °· Sweating. °There may also be pain and swelling in a leg if that is where the blood clot started. °These symptoms may represent a serious problem that is an emergency. Do not wait to see if the symptoms will go away. Get medical help right away. Call your local emergency services (911 in the [place] Do not drive yourself to the hospital. °DIAGNOSIS °Your health care provider will take a medical history and perform a physical exam. You may also   have other tests, including: °· Blood tests to assess the clotting properties of your blood. °· Imaging tests, such as CT, ultrasound, MRI, X-ray, and other tests to see if you have clots anywhere in your body. °TREATMENT °After a DVT is identified, it can be treated. The type of treatment that you receive depends on many factors, such as the cause of your DVT, your risk for bleeding or developing more clots, and other medical conditions that you have. Sometimes, a combination of treatments is necessary. Treatment  options may be combined and include: °· Monitoring the blood clot with ultrasound. °· Taking medicines by mouth, such as newer blood thinners (anticoagulants), thrombolytics, or warfarin. °· Taking anticoagulant medicine by injection or through an IV tube. °· Wearing compression stockings or using different types of devices. °· Surgery (rare) to remove the blood clot or to place a filter in your abdomen to stop the blood clot from traveling to your lungs. °Treatments for a DVT are often divided into immediate treatment and long-term treatment (up to 3 months after DVT). You can work with your health care provider to choose the treatment program that is best for you. °HOME CARE INSTRUCTIONS °If you are taking a newer oral anticoagulant: °· Take the medicine every single day at the same time each day. °· Understand what foods and drugs interact with this medicine. °· Understand that there are no regular blood tests required when using this medicine. °· Understand the side effects of this medicine, including excessive bruising or bleeding. Ask your health care provider or pharmacist about other possible side effects. °If you are taking warfarin: °· Understand how to take warfarin and know which foods can affect how warfarin works in your body. °· Understand that it is dangerous to take too much or too little warfarin. Too much warfarin increases the risk of bleeding. Too little warfarin continues to allow the risk for blood clots. °· Follow your PT and INR blood testing schedule. The PT and INR results allow your health care provider to adjust your dose of warfarin. It is very important that you have your PT and INR tested as often as told by your health care provider. °· Avoid major changes in your diet, or tell your health care provider before you change your diet. Arrange a visit with a registered dietitian to answer your questions. Many foods, especially foods that are high in vitamin K, can interfere with warfarin  and affect the PT and INR results. Eat a consistent amount of foods that are high in vitamin K, such as: °¨ Spinach, kale, broccoli, cabbage, collard greens, turnip greens, Brussels sprouts, peas, cauliflower, seaweed, and parsley. °¨ Beef liver and pork liver. °¨ Green tea. °¨ Soybean oil. °· Tell your health care provider about any and all medicines, vitamins, and supplements that you take, including aspirin and other over-the-counter anti-inflammatory medicines. Be especially cautious with aspirin and anti-inflammatory medicines. Do not take those before you ask your health care provider if it is safe to do so. This is important because many medicines can interfere with warfarin and affect the PT and INR results. °· Do not start or stop taking any over-the-counter or prescription medicine unless your health care provider or pharmacist tells you to do so. °If you take warfarin, you will also need to do these things: °· Hold pressure over cuts for longer than usual. °· Tell your dentist and other health care providers that you are taking warfarin before you have any procedures in which   bleeding may occur. °· Avoid alcohol or drink very small amounts. Tell your health care provider if you change your alcohol intake. °· Do not use tobacco products, including cigarettes, chewing tobacco, and e-cigarettes. If you need help quitting, ask your health care provider. °· Avoid contact sports. °General Instructions °· Take over-the-counter and prescription medicines only as told by your health care provider. Anticoagulant medicines can have side effects, including easy bruising and difficulty stopping bleeding. If you are prescribed an anticoagulant, you will also need to do these things: °¨ Hold pressure over cuts for longer than usual. °¨ Tell your dentist and other health care providers that you are taking anticoagulants before you have any procedures in which bleeding may occur. °¨ Avoid contact sports. °· Wear a medical  alert bracelet or carry a medical alert card that says you have had a PE. °· Ask your health care provider how soon you can go back to your normal activities. Stay active to prevent new blood clots from forming. °· Make sure to exercise while traveling or when you have been sitting or standing for a long period of time. It is very important to exercise. Exercise your legs by walking or by tightening and relaxing your leg muscles often. Take frequent walks. °· Wear compression stockings as told by your health care provider to help prevent more blood clots from forming. °· Do not use tobacco products, including cigarettes, chewing tobacco, and e-cigarettes. If you need help quitting, ask your health care provider. °· Keep all follow-up appointments with your health care provider. This is important. °PREVENTION °Take these actions to decrease your risk of developing another DVT: °· Exercise regularly. For at least 30 minutes every day, engage in: °¨ Activity that involves moving your arms and legs. °¨ Activity that encourages good blood flow through your body by increasing your heart rate. °· Exercise your arms and legs every hour during long-distance travel (over 4 hours). Drink plenty of water and avoid drinking alcohol while traveling. °· Avoid sitting or lying in bed for long periods of time without moving your legs. °· Maintain a weight that is appropriate for your height. Ask your health care provider what weight is healthy for you. °· If you are a woman who is over 35 years of age, avoid unnecessary use of medicines that contain estrogen. These include birth control pills. °· Do not smoke, especially if you take estrogen medicines. If you need help quitting, ask your health care provider. °If you are hospitalized, prevention measures may include: °· Early walking after surgery, as soon as your health care provider says that it is safe. °· Receiving anticoagulants to prevent blood clots. If you cannot take  anticoagulants, other options may be available, such as wearing compression stockings or using different types of devices. °SEEK IMMEDIATE MEDICAL CARE IF: °· You have new or increased pain, swelling, or redness in an arm or leg. °· You have numbness or tingling in an arm or leg. °· You have shortness of breath while active or at rest. °· You have chest pain. °· You have a rapid or irregular heartbeat. °· You feel light-headed or dizzy. °· You cough up blood. °· You notice blood in your vomit, bowel movement, or urine. °These symptoms may represent a serious problem that is an emergency. Do not wait to see if the symptoms will go away. Get medical help right away. Call your local emergency services (911 in the [place] Do not drive yourself to the hospital. °  °  This information is not intended to replace advice given to you by your health care provider. Make sure you discuss any questions you have with your health care provider. °  °Document Released: 04/19/2005 Document Revised: 01/08/2015 Document Reviewed: 08/14/2014 °Elsevier Interactive Patient Education ©2016 [employer] ° °

## 2015-03-20 NOTE — ED Notes (Signed)
C/o right lower leg pain x 2 days, states pain is worse with ambulation, denies any injury

## 2015-03-20 NOTE — ED Notes (Signed)
Right lower leg pain for 2 days   States pain is mainly at post.calf area. No redness noted or injury

## 2015-04-20 ENCOUNTER — Emergency Department
Admission: EM | Admit: 2015-04-20 | Discharge: 2015-04-20 | Disposition: A | Discharge status: Home / Self Care | Payer: Medicaid Other | Attending: Emergency Medicine | Admitting: Emergency Medicine

## 2015-04-20 ENCOUNTER — Emergency Department: Payer: Medicaid Other

## 2015-04-20 ENCOUNTER — Encounter: Payer: Self-pay | Admitting: Emergency Medicine

## 2015-04-20 DIAGNOSIS — L03011 Cellulitis of right finger: Secondary | ICD-10-CM | POA: Diagnosis not present

## 2015-04-20 DIAGNOSIS — M79644 Pain in right finger(s): Secondary | ICD-10-CM | POA: Diagnosis present

## 2015-04-20 DIAGNOSIS — Z7901 Long term (current) use of anticoagulants: Secondary | ICD-10-CM | POA: Insufficient documentation

## 2015-04-20 DIAGNOSIS — I10 Essential (primary) hypertension: Secondary | ICD-10-CM | POA: Diagnosis not present

## 2015-04-20 DIAGNOSIS — Z79899 Other long term (current) drug therapy: Secondary | ICD-10-CM | POA: Insufficient documentation

## 2015-04-20 MED ORDER — HYDROCODONE-ACETAMINOPHEN 5-325 MG PO TABS: 1.0000 | ORAL_TABLET | ORAL | Status: DC | PRN

## 2015-04-20 MED ORDER — SULFAMETHOXAZOLE-TRIMETHOPRIM 800-160 MG PO TABS: 1.0000 | ORAL_TABLET | Freq: Two times a day (BID) | ORAL | Status: DC

## 2015-04-20 NOTE — ED Notes (Signed)
Pt states she pulled a hang nail about 2 weeks ago and since then her thumb has swollen and turned black. She also states it popped a few days ago and puss came out. pts originally thought it was because she has hx of blood clots she thought she had a blood cloth in that hand but after further speaking with pt she remembered she had a hang nail.

## 2015-04-20 NOTE — Discharge Instructions (Signed)
Paronychia  Paronychia is an infection of the skin. It happens near a fingernail or toenail. It may cause pain and swelling around the nail. Usually, it is not serious and it clears up with treatment. HOME CARE  Soak the fingers or toes in warm water as told by your doctor. You may be told to do this for 20 minutes, 2-3 times a day.  Keep the area dry when you are not soaking it.  Take medicines only as told by your doctor.  If you were given an antibiotic medicine, finish all of it even if you start to feel better.  Keep the affected area clean.  Do not try to drain a fluid-filled bump yourself.  Wear rubber gloves when putting your hands in water.  Wear gloves if your hands might touch cleaners or chemicals.  Follow your doctor's instructions about:  Wound care.  Bandage (dressing) changes and removal. GET HELP IF:  Your symptoms get worse or do not improve.  You have a fever or chills.  You have redness spreading from the affected area.  You have more fluid, blood, or pus coming from the affected area.  Your finger or knuckle is swollen or is hard to move.   This information is not intended to replace advice given to you by your health care provider. Make sure you discuss any questions you have with your health care provider.   Document Released: 04/07/2009 Document Revised: 09/03/2014 Document Reviewed: 03/27/2014 Elsevier Interactive Patient Education 2016 [place] and warm water for approximately 15-20 minutes 3-4 times a day. Begin antibiotic twice a day for 10 days. Follow-up with your doctor if any continued problems. Return to the emergency room if any severe worsening of your thumb or urgent concerns.

## 2015-04-20 NOTE — ED Provider Notes (Signed)
[facility] Emergency Department Provider Note  ____________________________________________  Time seen: Approximately 5:11 PM  I have reviewed the triage vital signs and the nursing notes.   HISTORY  Chief Complaint No chief complaint on file.  HPI Mary Sherman is a 29 y.o. female is here with complaint of right thumb pain and swelling. Patient states that it began opening up when she pressed on it 2 days ago and pus came out. Patient recalls pulling a hangnail off work from approximately 2 weeks ago before she began having symptoms. She denies any fever or chills. She denies any previous injury to her thumb. She states that her thumb is very tender and swollen today.Currently she rates her pain as a 10 out of 10. Patient has her infant child with her today and states she is not breast-feeding.   Past Medical History  Diagnosis Date  . HSV (herpes simplex virus) infection   . Obesity affecting pregnancy   . Hypertension 2014    Patient Active Problem List   Diagnosis Date Noted  . Labor and delivery indication for care or intervention 01/14/2015  . Abdominal pain in pregnancy 12/27/2014  . Maternal obesity syndrome in third trimester 12/26/2014  . Vaginal bleeding 11/15/2014    Past Surgical History  Procedure Laterality Date  . Cholecystectomy  2007    Current Outpatient Rx  Name  Route  Sig  Dispense  Refill  . chlorothiazide (DIURIL) 250 MG tablet   Oral   Take 250 mg by mouth every morning.         Marland Kitchen HYDROcodone-acetaminophen (NORCO/VICODIN) 5-325 MG tablet   Oral   Take 1 tablet by mouth every 4 (four) hours as needed for moderate pain.   20 tablet   0   . Prenatal Vit-Fe Fumarate-FA (PRENATAL MULTIVITAMIN) TABS tablet   Oral   Take 1 tablet by mouth daily at 12 noon.         . Rivaroxaban (XARELTO STARTER PACK) 15 & 20 MG TBPK      Take as directed on package: Start with one 15mg  tablet by mouth twice a day with food. On  Day 22, switch to one 20mg  tablet once a day with food.   51 each   0   . sulfamethoxazole-trimethoprim (BACTRIM DS,SEPTRA DS) 800-160 MG tablet   Oral   Take 1 tablet by mouth 2 (two) times daily.   20 tablet   0     Allergies Review of patient's allergies indicates no known allergies.  Family History  Problem Relation Age of Onset  . Family history unknown: Yes    Social History Social History  Substance Use Topics  . Smoking status: Never Smoker   . Smokeless tobacco: Never Used  . Alcohol Use: No    Review of Systems Constitutional: No fever/chills Cardiovascular: Denies chest pain. Respiratory: Denies shortness of breath. Gastrointestinal:   No nausea, no vomiting. Musculoskeletal: Negative for back pain. Positive for right thumb pain. Skin: Negative for rash. Positive for infection right thumb. Neurological: Negative for headaches, focal weakness or numbness.  10-point ROS otherwise negative.  ____________________________________________   PHYSICAL EXAM:  VITAL SIGNS: ED Triage Vitals  Enc Vitals Group     BP --      Pulse --      Resp --      Temp --      Temp src --      SpO2 --      Weight --  Height --      Head Cir --      Peak Flow --      Pain Score --      Pain Loc --      Pain Edu? --      Excl. in [place]? --     Constitutional: Alert and oriented. Well appearing and in no acute distress. Eyes: Conjunctivae are normal. PERRL. EOMI. Head: Atraumatic. Nose: No congestion/rhinnorhea. Neck: No stridor.   Cardiovascular: Normal rate, regular rhythm. Grossly normal heart sounds.  Good peripheral circulation. Respiratory: Normal respiratory effort.  No retractions. Lungs CTAB. Gastrointestinal:  No distention.  Musculoskeletal: Moves upper and lower extremities without any difficulty. There is some minimal restriction of the right palm secondary to pain and swelling. Neurologic:  Normal speech and language. No gross focal neurologic  deficits are appreciated. No gait instability. Skin:  Skin is warm, dry and intact. Right thumb dorsum at the base of the cuticle moderate swelling and tenderness present. There is a fluctuant area present. No expanding cellulitis was noted. Motor sensory function intact. Psychiatric: Mood and affect are normal. Speech and behavior are normal.  ____________________________________________   LABS (all labs ordered are listed, but only abnormal results are displayed)  Labs Reviewed - No data to display RADIOLOGY  Right thumb x-ray per radiologist no signs of osteomyelitis. ____________________________________________   PROCEDURES  Procedure(s) performed: None  Critical Care performed: No  ____________________________________________   INITIAL IMPRESSION / ASSESSMENT AND PLAN / ED COURSE  Pertinent labs & imaging results that were available during my care of the patient were reviewed by me and considered in my medical decision making (see chart for details).  She was started on Septra DS twice a day for 10 days. Patient is to do warm water from moist heat soaks to the right thumb. She is return to the emergency room if any severe worsening of her symptoms or fever and chills. ____________________________________________   FINAL CLINICAL IMPRESSION(S) / ED DIAGNOSES  Final diagnoses:  Paronychia of thumb, right      Johnn Hai, PA-C 04/20/15 1846  Harvest Dark, MD 04/20/15 2135

## 2015-09-04 ENCOUNTER — Encounter: Payer: Self-pay | Admitting: Oncology

## 2015-09-04 ENCOUNTER — Inpatient Hospital Stay: Payer: Medicaid Other | Attending: Oncology | Admitting: Oncology

## 2015-09-04 VITALS — BP 123/79 | HR 88 | Temp 98.3°F | Resp 18

## 2015-09-04 DIAGNOSIS — R5381 Other malaise: Secondary | ICD-10-CM | POA: Insufficient documentation

## 2015-09-04 DIAGNOSIS — R531 Weakness: Secondary | ICD-10-CM

## 2015-09-04 DIAGNOSIS — Z7901 Long term (current) use of anticoagulants: Secondary | ICD-10-CM | POA: Diagnosis not present

## 2015-09-04 DIAGNOSIS — Z79899 Other long term (current) drug therapy: Secondary | ICD-10-CM | POA: Diagnosis not present

## 2015-09-04 DIAGNOSIS — R5383 Other fatigue: Secondary | ICD-10-CM | POA: Diagnosis not present

## 2015-09-04 DIAGNOSIS — O2230 Deep phlebothrombosis in pregnancy, unspecified trimester: Secondary | ICD-10-CM

## 2015-09-04 DIAGNOSIS — Z86718 Personal history of other venous thrombosis and embolism: Secondary | ICD-10-CM | POA: Insufficient documentation

## 2015-09-04 DIAGNOSIS — I10 Essential (primary) hypertension: Secondary | ICD-10-CM | POA: Diagnosis not present

## 2015-09-04 DIAGNOSIS — M549 Dorsalgia, unspecified: Secondary | ICD-10-CM | POA: Diagnosis not present

## 2015-09-04 DIAGNOSIS — M2550 Pain in unspecified joint: Secondary | ICD-10-CM | POA: Diagnosis not present

## 2015-09-04 NOTE — Progress Notes (Signed)
Patient had DVT in 03/2015 which was 6 weeks postpartum so she initiated Xarelto.  She is here today to discuss how long to continue the Xarelto.

## 2015-09-07 NOTE — Progress Notes (Signed)
[facility]  Telephone:([phone]) [phone] Fax:([phone]) [phone]  ID: Mary Sherman OB: 04-09-1986  MR#: [id number]  [id number]  Patient Care Team: Donnie Coffin, MD as PCP - General (Family Medicine)  CHIEF COMPLAINT:  Chief Complaint  Patient presents with  . New Evaluation    INTERVAL HISTORY: Patient is a morbidly obese 30 year old female who was noted to have a DVT low the knee proximally 6 weeks postpartum in November 2016. She was initiated on Xarelto at that time and has been referred to clinic to determine the length of anticoagulation. Currently, she feels significantly weakness and fatigue as well as bilateral leg and knee pain which she attributes to standing at her job. She has no neurologic complaints. She denies any easy bleeding or bruising. She denies any chest pain or shortness of breath. She has a good appetite and denies weight loss. She denies any nausea, vomiting, constipation, or diarrhea. She has no urinary complaints. Patient otherwise feels well and offers no further specific complaints.  REVIEW OF SYSTEMS:   Review of Systems  Constitutional: Positive for malaise/fatigue. Negative for fever and weight loss.  Respiratory: Negative.  Negative for hemoptysis and shortness of breath.   Cardiovascular: Negative.  Negative for chest pain and leg swelling.  Gastrointestinal: Negative.  Negative for blood in stool and melena.  Genitourinary: Negative.   Musculoskeletal: Positive for back pain and joint pain.  Neurological: Positive for weakness.  Endo/Heme/Allergies: Does not bruise/bleed easily.  Psychiatric/Behavioral: Negative.     As per HPI. Otherwise, a complete review of systems is negatve.  PAST MEDICAL HISTORY: Past Medical History  Diagnosis Date  . HSV (herpes simplex virus) infection   . Obesity affecting pregnancy   . Hypertension 2014  . DVT (deep venous thrombosis) ([place])     PAST SURGICAL HISTORY: Past Surgical History    Procedure Laterality Date  . Cholecystectomy  2007    FAMILY HISTORY Family History  Problem Relation Age of Onset  . Family history unknown: Yes       ADVANCED DIRECTIVES:    HEALTH MAINTENANCE: Social History  Substance Use Topics  . Smoking status: Never Smoker   . Smokeless tobacco: Never Used  . Alcohol Use: No     Colonoscopy:  PAP:  Bone density:  Lipid panel:  No Known Allergies  Current Outpatient Prescriptions  Medication Sig Dispense Refill  . chlorothiazide (DIURIL) 250 MG tablet Take 250 mg by mouth every morning. Reported on 09/04/2015    . rivaroxaban (XARELTO) 20 MG TABS tablet Take 20 mg by mouth daily with supper.     No current facility-administered medications for this visit.    OBJECTIVE: Filed Vitals:   09/04/15 1524  BP: 123/79  Pulse: 88  Temp: 98.3 F (36.8 C)  Resp: 18     There is no weight on file to calculate BMI.    ECOG FS:1 - Symptomatic but completely ambulatory  General: Morbidly obese, sitting in a wheelchair. Eyes: Pink conjunctiva, anicteric sclera. HEENT: Normocephalic, moist mucous membranes, clear oropharnyx. Lungs: Clear to auscultation bilaterally. Heart: Regular rate and rhythm. No rubs, murmurs, or gallops. Abdomen: Soft, nontender, nondistended. No organomegaly noted, normoactive bowel sounds. Musculoskeletal: No edema, cyanosis, or clubbing. Neuro: Alert, answering all questions appropriately. Cranial nerves grossly intact. Skin: No rashes or petechiae noted. Psych: Normal affect.   LAB RESULTS:  No results found for: NA, K, CL, CO2, GLUCOSE, BUN, CREATININE, CALCIUM, PROT, ALBUMIN, AST, ALT, ALKPHOS, BILITOT, GFRNONAA, GFRAA  Lab Results  Component Value Date   WBC 13.9* 01/16/2015   NEUTROABS 5.7 12/28/2014   HGB 11.7* 01/16/2015   HCT 35.1 01/16/2015   MCV 83.5 01/16/2015   PLT 208 01/16/2015     STUDIES: No results found.  ASSESSMENT: Right calf DVT.  PLAN:    1. Right calf DVT:  Typically blood clots below the knee did not require treatment. At most, 3 months of anticoagulation is adequate. Patient does not require any additional Xarelto and has been instructed to discontinue. Because patient had multiple transient risk factors including being postpartum and extended travel, a hypercoagulable workup is not necessary. Her morbid obesity also puts her at increased risk.  Patient reports that she travels back and forth to [place] frequently to visit her sister and she was educated on the risks of developing a second DVT. Patient also states she might be pregnant again. If her pregnancy tests is positive, she does not require anticoagulation prophylactically but would recommend Lovenox if she develops a second DVT. No further follow-up has been scheduled.  Patient expressed understanding and was in agreement with this plan. She also understands that She can call clinic at any time with any questions, concerns, or complaints.    Lloyd Huger, MD   09/07/2015 10:01 AM

## 2016-04-23 IMAGING — US US EXTREM LOW VENOUS*R*
1 series · 13 of 24 positions shown · non-contrast
Comparison: None.

CLINICAL DATA: Acute nontraumatic right calf pain for 2 days.



[Series 1: us extrem low venous*right* · 0.08mm/px · 13 of 36 slices shown]
[im 1/36]
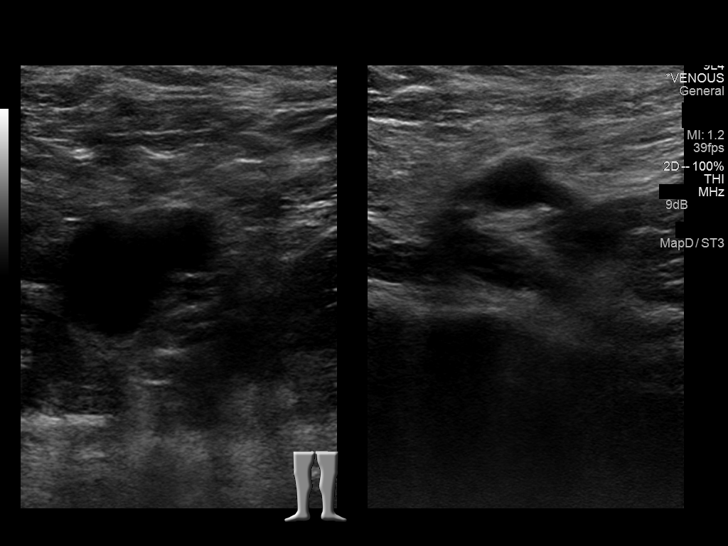
[im 4/36]
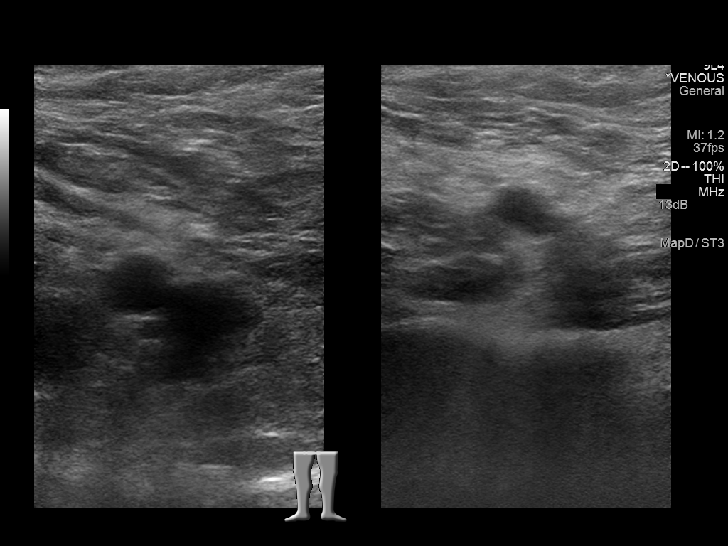
[im 7/36]
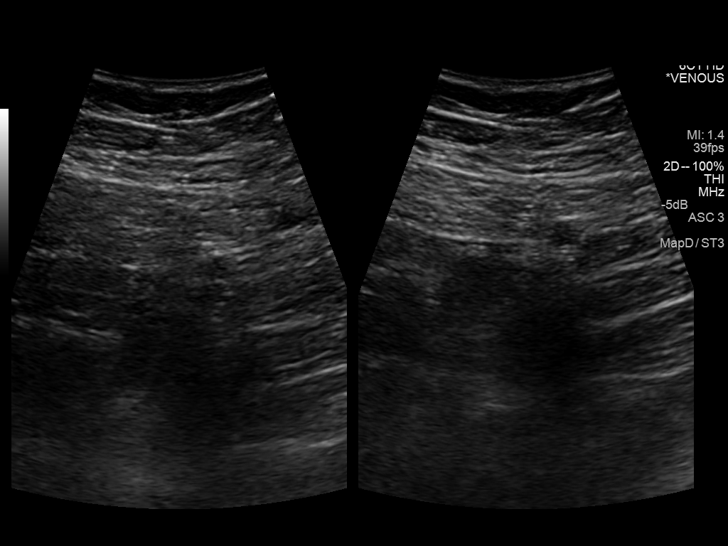
[im 10/36]
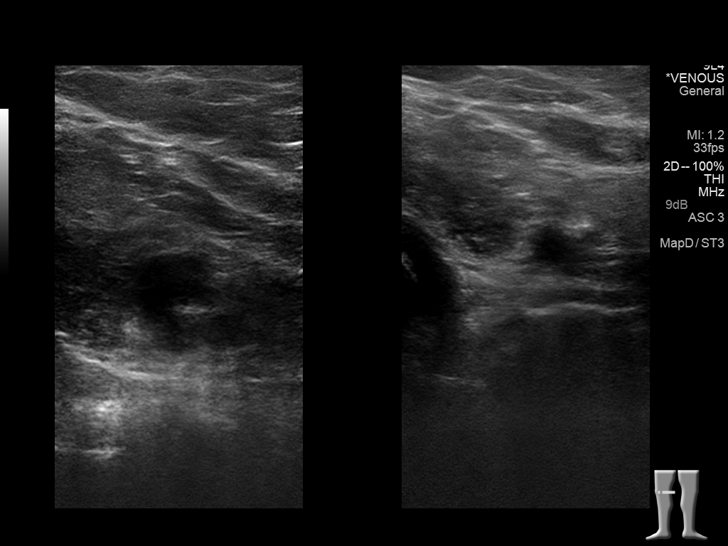
[im 13/36]
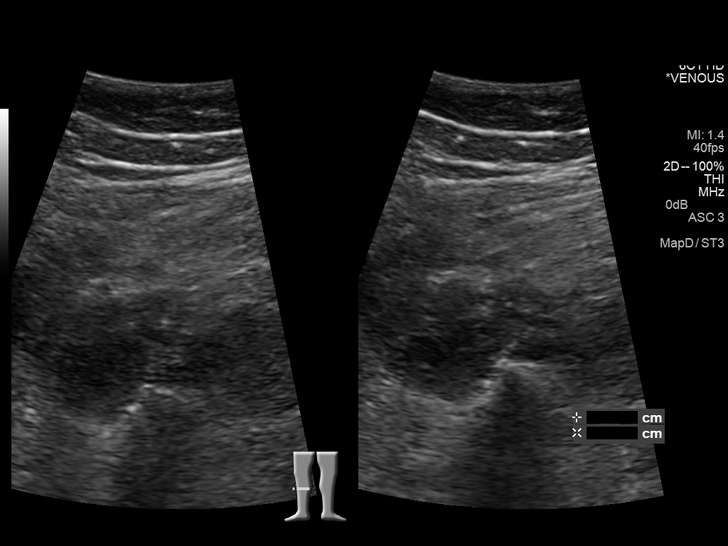
[im 16/36]
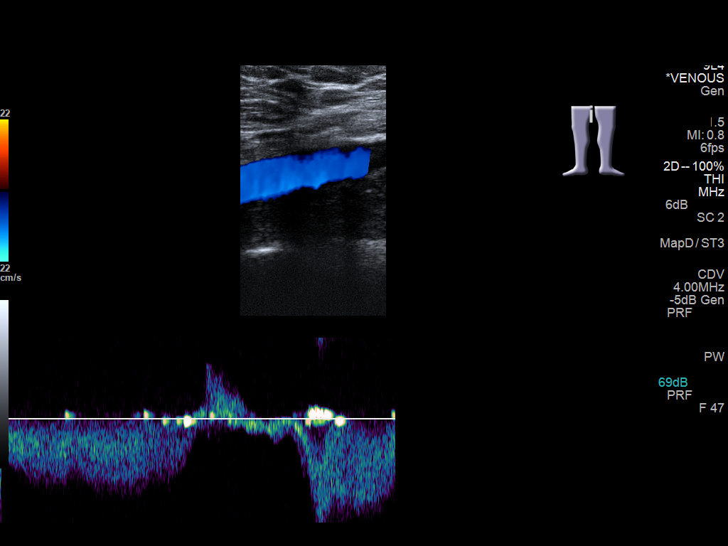
[im 19/36]
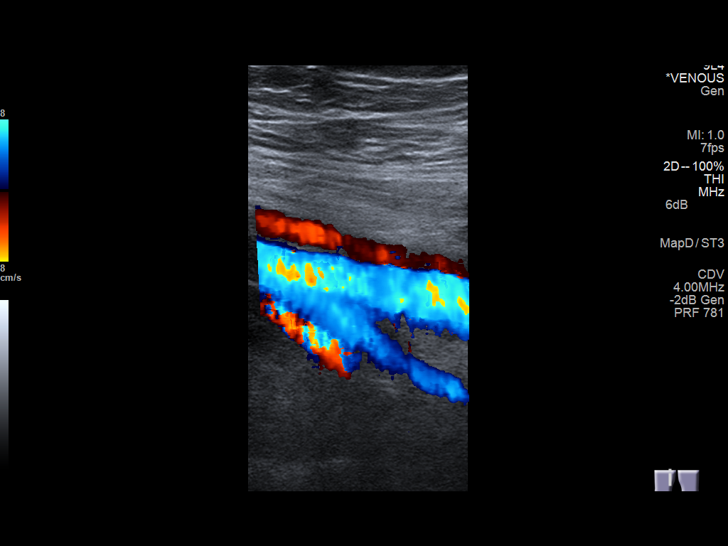
[im 20/36]
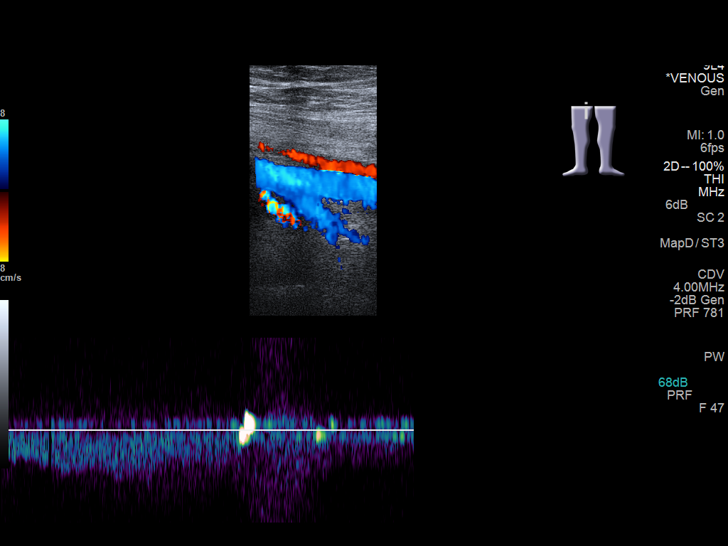
[im 23/36]
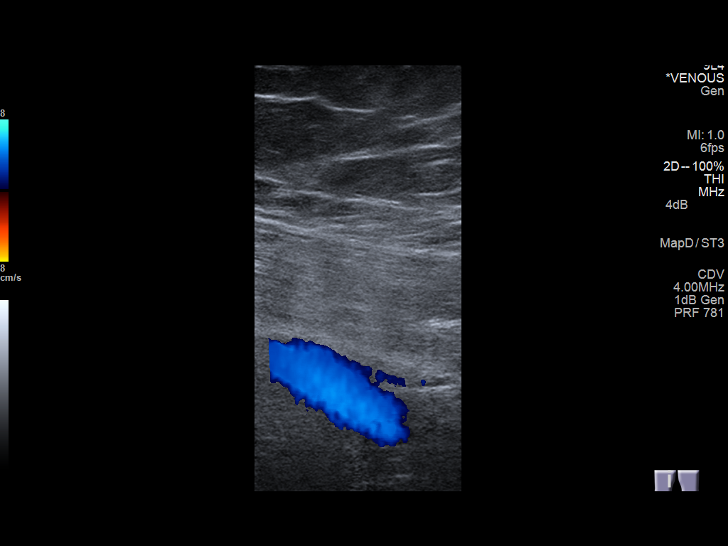
[im 26/36]
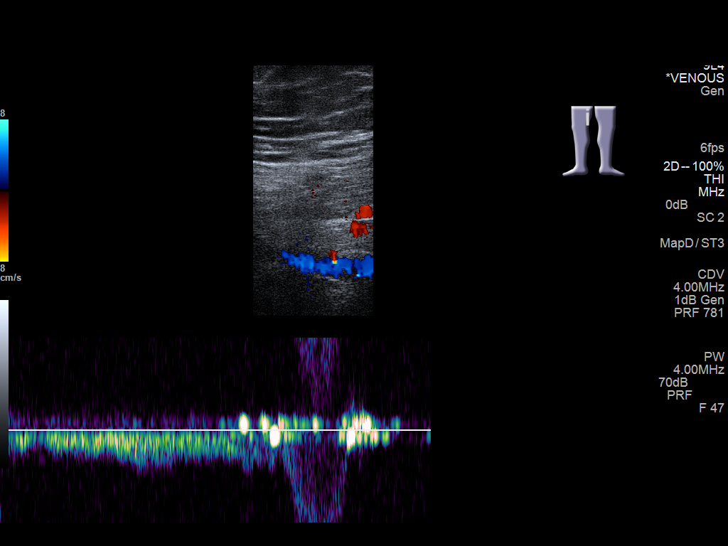
[im 29/36]
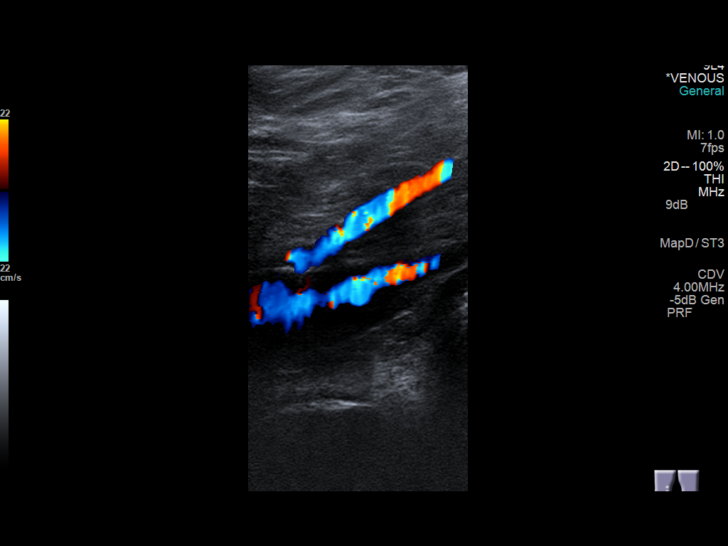
[im 32/36]
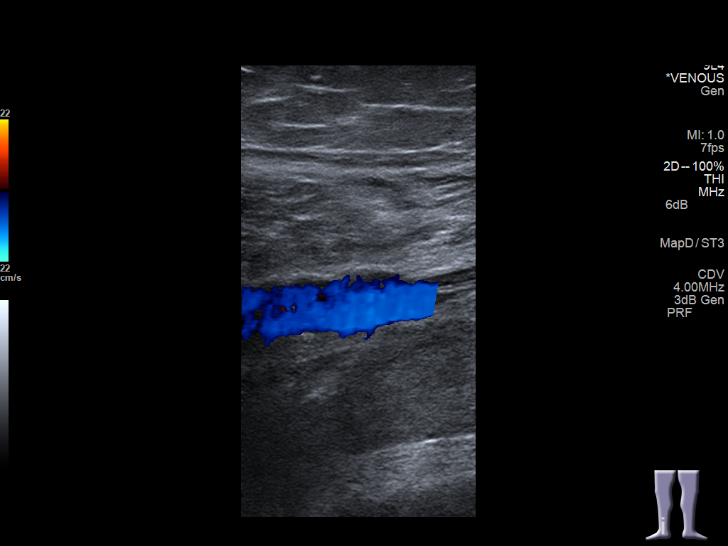
[im 36/36]
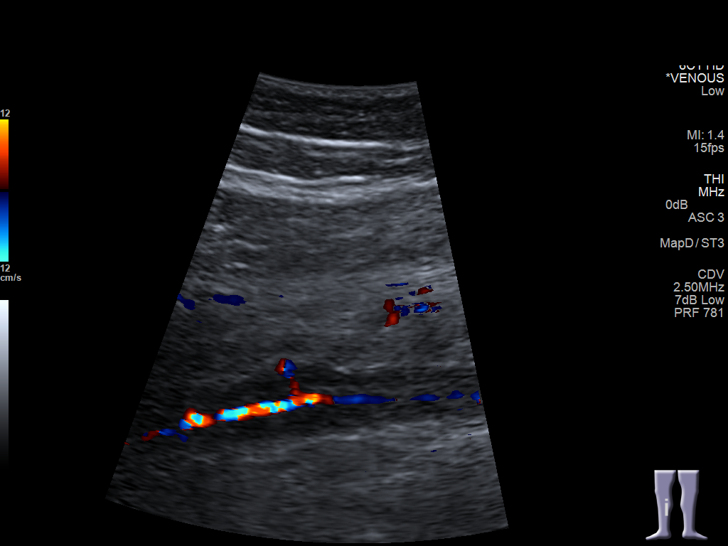

[13 of 24 positions shown; findings below may reference images not displayed]

FINDINGS: Contralateral Common Femoral Vein: Respiratory phasicity is normal
and symmetric with the symptomatic side. No evidence of thrombus.
Normal compressibility.

Common Femoral Vein: No evidence of thrombus. Normal
compressibility, respiratory phasicity and response to augmentation.

Saphenofemoral Junction: No evidence of thrombus. Normal
compressibility and flow on color Doppler imaging.

Profunda Femoral Vein: No evidence of thrombus. Normal
compressibility and flow on color Doppler imaging.

Femoral Vein: No evidence of thrombus. Normal compressibility,
respiratory phasicity and response to augmentation.

Popliteal Vein: No evidence of thrombus. Normal compressibility,
respiratory phasicity and response to augmentation.

Calf Veins: No abnormality seen in the posterior tibial vein.
Noncompressibility of the right peritoneal vein is noted without
flow consistent with occlusive thrombus.

Superficial Great Saphenous Vein: No evidence of thrombus. Normal
compressibility and flow on color Doppler imaging.

Venous Reflux:  None.

Other Findings:  None.
IMPRESSION: Probable occlusive thrombus seen in right peroneal vein.

## 2016-07-16 ENCOUNTER — Other Ambulatory Visit: Payer: Self-pay | Admitting: Family Medicine

## 2016-07-22 ENCOUNTER — Other Ambulatory Visit: Payer: Self-pay | Admitting: Family Medicine

## 2016-07-22 DIAGNOSIS — Z369 Encounter for antenatal screening, unspecified: Secondary | ICD-10-CM

## 2016-08-05 ENCOUNTER — Emergency Department
Admission: EM | Admit: 2016-08-05 | Discharge: 2016-08-05 | Disposition: A | Discharge status: Home / Self Care | Payer: Medicaid Other | Attending: Emergency Medicine | Admitting: Emergency Medicine

## 2016-08-05 ENCOUNTER — Emergency Department: Payer: Medicaid Other

## 2016-08-05 ENCOUNTER — Encounter: Payer: Self-pay | Admitting: Emergency Medicine

## 2016-08-05 DIAGNOSIS — I10 Essential (primary) hypertension: Secondary | ICD-10-CM | POA: Insufficient documentation

## 2016-08-05 DIAGNOSIS — Z79899 Other long term (current) drug therapy: Secondary | ICD-10-CM | POA: Diagnosis not present

## 2016-08-05 DIAGNOSIS — Z3A10 10 weeks gestation of pregnancy: Secondary | ICD-10-CM | POA: Insufficient documentation

## 2016-08-05 DIAGNOSIS — O210 Mild hyperemesis gravidarum: Secondary | ICD-10-CM | POA: Insufficient documentation

## 2016-08-05 DIAGNOSIS — O219 Vomiting of pregnancy, unspecified: Secondary | ICD-10-CM | POA: Diagnosis present

## 2016-08-05 LAB — HCG, QUANTITATIVE, PREGNANCY: hCG, Beta Chain, Quant, S: 193709 m[IU]/mL — ABNORMAL HIGH (ref <5)

## 2016-08-05 LAB — URINALYSIS, COMPLETE (UACMP) WITH MICROSCOPIC
BILIRUBIN URINE: NEGATIVE
Bacteria, UA: NONE SEEN
Glucose, UA: NEGATIVE mg/dL
Hgb urine dipstick: NEGATIVE
KETONES UR: 5 mg/dL — AB
LEUKOCYTES UA: NEGATIVE
Nitrite: NEGATIVE
PROTEIN: NEGATIVE mg/dL
RBC / HPF: NONE SEEN RBC/hpf (ref 0–5)
Specific Gravity, Urine: 1.005 (ref 1.005–1.030)
pH: 7 (ref 5.0–8.0)

## 2016-08-05 LAB — CBC
HCT: 34.8 % — ABNORMAL LOW (ref 35.0–47.0)
HEMOGLOBIN: 12 g/dL (ref 12.0–16.0)
MCH: 28.7 pg (ref 26.0–34.0)
MCHC: 34.5 g/dL (ref 32.0–36.0)
MCV: 83.2 fL (ref 80.0–100.0)
Platelets: 245 10*3/uL (ref 150–440)
RBC: 4.18 MIL/uL (ref 3.80–5.20)
RDW: 14.6 % — ABNORMAL HIGH (ref 11.5–14.5)
WBC: 9.3 10*3/uL (ref 3.6–11.0)

## 2016-08-05 LAB — COMPREHENSIVE METABOLIC PANEL
ALK PHOS: 44 U/L (ref 38–126)
ALT: 84 U/L — AB (ref 14–54)
AST: 36 U/L (ref 15–41)
Albumin: 3 g/dL — ABNORMAL LOW (ref 3.5–5.0)
Anion gap: 5 (ref 5–15)
BUN: 7 mg/dL (ref 6–20)
CHLORIDE: 105 mmol/L (ref 101–111)
CO2: 26 mmol/L (ref 22–32)
CREATININE: 0.79 mg/dL (ref 0.44–1.00)
Calcium: 8.8 mg/dL — ABNORMAL LOW (ref 8.9–10.3)
GFR calc Af Amer: >60 mL/min (ref >60)
GFR calc non Af Amer: >60 mL/min (ref >60)
Glucose, Bld: 88 mg/dL (ref 65–99)
Potassium: 3.4 mmol/L — ABNORMAL LOW (ref 3.5–5.1)
SODIUM: 136 mmol/L (ref 135–145)
Total Bilirubin: 0.5 mg/dL (ref 0.3–1.2)
Total Protein: 6.7 g/dL (ref 6.5–8.1)

## 2016-08-05 MED ORDER — ONDANSETRON HCL 4 MG/2ML IJ SOLN
INTRAMUSCULAR | Status: AC
  Filled 2016-08-05: qty 2

## 2016-08-05 MED ORDER — SODIUM CHLORIDE 0.9 % IV BOLUS (SEPSIS)
1000.0000 mL | Freq: Once | INTRAVENOUS | Status: AC
  Administered 2016-08-05: 1000 mL via INTRAVENOUS

## 2016-08-05 MED ORDER — METOCLOPRAMIDE HCL 5 MG/ML IJ SOLN
10.0000 mg | Freq: Once | INTRAMUSCULAR | Status: AC
  Administered 2016-08-05: 10 mg via INTRAVENOUS
  Filled 2016-08-05: qty 2

## 2016-08-05 MED ORDER — PROMETHAZINE HCL 25 MG/ML IJ SOLN
12.5000 mg | Freq: Once | INTRAMUSCULAR | Status: AC
  Administered 2016-08-05: 12.5 mg via INTRAVENOUS
  Filled 2016-08-05: qty 1

## 2016-08-05 MED ORDER — METOCLOPRAMIDE HCL 10 MG PO TABS: 10.0000 mg | ORAL_TABLET | Freq: Three times a day (TID) | ORAL | 0 refills | Status: DC | PRN

## 2016-08-05 NOTE — ED Notes (Signed)
Pt given ginger ale. Will continue to monitor to see how pt tolerates.

## 2016-08-05 NOTE — ED Notes (Signed)
Patient transported to Ultrasound 

## 2016-08-05 NOTE — ED Triage Notes (Addendum)
Pt presents to ED with c/o vomiting X3 Wednesday. Pt states the last time she vomited (around 2300) she began to develop chest pain. Pain and nausea have continued. Pt alert and appears anxious at this time. Currently 10w 6d pregnant.

## 2016-08-05 NOTE — ED Notes (Signed)
Pt tolerated ginger ale without difficulty, no emesis at this time.

## 2016-08-05 NOTE — ED Notes (Signed)
Pt reports she is hungry and thirsty, MD aware. Waiting to see how pt tolerates nausea medicine.

## 2016-08-05 NOTE — ED Provider Notes (Signed)
[facility] Emergency Department Provider Note   First MD Initiated Contact with Patient 08/05/16 [phone]     (approximate)  I have reviewed the triage vital signs and the nursing notes.   HISTORY  Chief Complaint Emesis; Dehydration; and Chest Pain   HPI KYNDALL AMERO is a 31 y.o. female with history of hypertension G5 P2 (1 miscarriage one elective abortion) 10 weeks 6 days pregnant presents to the emergency department with vomiting 4 weeks with acute worsening over the past day. Patient states inability to tolerate by mouth liquid or solid. Patient states last episode of emesis was at 11:00 PM. Of note patient states that she had burning chest discomfort while vomiting which is since resolved. Patient denies any shortness of breath. Patient states hyperemesis unrelieved with anti-medics prescribed by OB/GYN.   Past Medical History:  Diagnosis Date  . DVT (deep venous thrombosis) ([place])   . HSV (herpes simplex virus) infection   . Hypertension 2014  . Obesity affecting pregnancy     Patient Active Problem List   Diagnosis Date Noted  . Labor and delivery indication for care or intervention 01/14/2015  . Abdominal pain in pregnancy 12/27/2014  . Maternal obesity syndrome in third trimester 12/26/2014  . Vaginal bleeding 11/15/2014    Past Surgical History:  Procedure Laterality Date  . CHOLECYSTECTOMY  2007    Prior to Admission medications   Medication Sig Start Date End Date Taking? Authorizing Provider  chlorothiazide (DIURIL) 250 MG tablet Take 250 mg by mouth every morning. Reported on 09/04/2015    Historical Provider, MD  rivaroxaban (XARELTO) 20 MG TABS tablet Take 20 mg by mouth daily with supper.    Historical Provider, MD    Allergies Patient has no known allergies.  Family History  Problem Relation Age of Onset  . Family history unknown: Yes    Social History Social History  Substance Use Topics  . Smoking status: Never  Smoker  . Smokeless tobacco: Never Used  . Alcohol use No    Review of Systems Constitutional: No fever/chills Eyes: No visual changes. ENT: No sore throat. Cardiovascular: Denies chest pain. Respiratory: Denies shortness of breath. Gastrointestinal: No abdominal pain.  Positive for vomiting.  Genitourinary: Negative for dysuria. Musculoskeletal: Negative for back pain. Skin: Negative for rash. Neurological: Negative for headaches, focal weakness or numbness.  10-point ROS otherwise negative.  ____________________________________________   PHYSICAL EXAM:  VITAL SIGNS: ED Triage Vitals  Enc Vitals Group     BP 08/05/16 0240 (!) 163/106     Pulse Rate 08/05/16 0240 67     Resp 08/05/16 0240 (!) 22     Temp 08/05/16 0240 98.4 F (36.9 C)     Temp Source 08/05/16 0240 Oral     SpO2 08/05/16 0240 97 %     Weight 08/05/16 0241 (!) 330 lb (149.7 kg)     Height 08/05/16 0241 5\' 4"  (1.626 m)     Head Circumference --      Peak Flow --      Pain Score --      Pain Loc --      Pain Edu? --      Excl. in [place]? --     Constitutional: Alert and oriented. Well appearing and in no acute distress. Eyes: Conjunctivae are normal. PERRL. EOMI. Head: Atraumatic.Marland Kitchen Mouth/Throat: Mucous membranes are dry.  Oropharynx non-erythematous. Neck: No stridor.   Cardiovascular: Normal rate, regular rhythm. Good peripheral circulation. Grossly normal heart sounds.  Respiratory: Normal respiratory effort.  No retractions. Lungs CTAB. Gastrointestinal: Soft and nontender. No distention.  Musculoskeletal: No lower extremity tenderness nor edema. No gross deformities of extremities. Neurologic:  Normal speech and language. No gross focal neurologic deficits are appreciated.  Skin:  Skin is warm, dry and intact. No rash noted.   ____________________________________________   LABS (all labs ordered are listed, but only abnormal results are displayed)  Labs Reviewed  CBC - Abnormal; Notable for  the following:       Result Value   HCT 34.8 (*)    RDW 14.6 (*)    All other components within normal limits  COMPREHENSIVE METABOLIC PANEL - Abnormal; Notable for the following:    Potassium 3.4 (*)    Calcium 8.8 (*)    Albumin 3.0 (*)    ALT 84 (*)    All other components within normal limits  HCG, QUANTITATIVE, PREGNANCY - Abnormal; Notable for the following:    hCG, Beta Chain, Quant, S 193,709 (*)    All other components within normal limits  URINALYSIS, COMPLETE (UACMP) WITH MICROSCOPIC   ____________________________________________  EKG  ED ECG REPORT I, Covington N Tanetta Fuhriman, the attending physician, personally viewed and interpreted this ECG.   Date: 08/05/2016  EKG Time: 2:44 AM  Rate: 71  Rhythm: Normal sinus rhythm  Axis: Normal  Intervals: Normal  ST&T Change: None  ____________________________________________  RADIOLOGY I, Vaughn N Haydan Wedig, personally viewed and evaluated these images (plain radiographs) as part of my medical decision making, as well as reviewing the written report by the radiologist.  US Ob Comp Less 14 Wks  Result Date: 08/05/2016 CLINICAL DATA:  Pelvic pain and emesis for 6 hours EXAM: OBSTETRIC <14 WK ULTRASOUND TECHNIQUE: Transabdominal ultrasound was performed for evaluation of the gestation as well as the maternal uterus and adnexal regions. COMPARISON:  None. FINDINGS: Intrauterine gestational sac: Single Yolk sac:  Visible Embryo:  Visible Cardiac Activity: Visible Heart Rate: 168 bpm MSD:   mm    w     d CRL:   32  mm   10 w 1 d                  [place] EDC: 03/02/2017 Subchorionic hemorrhage:  None visualized. Maternal uterus/adnexae: 6 cm midbody myometrial lesion, likely a fibroid. No abnormal pelvic fluid collections. IMPRESSION: *Single living intrauterine gestation measuring 10 weeks 1 day by crown-rump length. *6 cm midbody uterine fibroid Electronically Signed   By: Andreas Newport M.D.   On: 08/05/2016 05:25      Procedures   ____________________________________________   INITIAL IMPRESSION / ASSESSMENT AND PLAN / ED COURSE  Pertinent labs & imaging results that were available during my care of the patient were reviewed by me and considered in my medical decision making (see chart for details).  Patient given IV Phenergan and Reglan with resolution of nausea and vomiting. Patient able to tolerate eating and drinking before discharge from the emergency department.    ____________________________________________  FINAL CLINICAL IMPRESSION(S) / ED DIAGNOSES  Final diagnoses:  Hyperemesis gravidarum     MEDICATIONS GIVEN DURING THIS VISIT:  Medications  sodium chloride 0.9 % bolus 1,000 mL (1,000 mLs Intravenous New Bag/Given 08/05/16 0542)  sodium chloride 0.9 % bolus 1,000 mL (1,000 mLs Intravenous New Bag/Given 08/05/16 0401)  promethazine (PHENERGAN) injection 12.5 mg (12.5 mg Intravenous Given 08/05/16 0413)  metoCLOPramide (REGLAN) injection 10 mg (10 mg Intravenous Given 08/05/16 0418)     NEW OUTPATIENT MEDICATIONS STARTED  DURING THIS VISIT:  New Prescriptions   No medications on file    Modified Medications   No medications on file    Discontinued Medications   No medications on file     Note:  This document was prepared using Dragon voice recognition software and may include unintentional dictation errors.    Gregor Hams, MD 08/05/16 [phone]

## 2016-08-05 NOTE — ED Notes (Signed)
Pt given crackers and peanut butter. Pt able to tolerate it without difficulty.

## 2016-08-16 ENCOUNTER — Ambulatory Visit: Payer: Medicaid Other

## 2016-08-19 ENCOUNTER — Ambulatory Visit
Admission: RE | Admit: 2016-08-19 | Discharge: 2016-08-19 | Disposition: A | Discharge status: Home / Self Care | Payer: Medicaid Other | Source: Ambulatory Visit | Attending: Maternal & Fetal Medicine | Admitting: Maternal & Fetal Medicine

## 2016-08-19 ENCOUNTER — Telehealth: Payer: Self-pay | Admitting: *Deleted

## 2016-08-19 ENCOUNTER — Ambulatory Visit (HOSPITAL_BASED_OUTPATIENT_CLINIC_OR_DEPARTMENT_OTHER)
Admission: RE | Admit: 2016-08-19 | Discharge: 2016-08-19 | Disposition: A | Discharge status: Home / Self Care | Payer: Medicaid Other | Source: Ambulatory Visit | Attending: Maternal & Fetal Medicine | Admitting: Maternal & Fetal Medicine

## 2016-08-19 VITALS — BP 150/78 | HR 74 | Resp 18 | Wt 344.0 lb

## 2016-08-19 DIAGNOSIS — Z3A12 12 weeks gestation of pregnancy: Secondary | ICD-10-CM | POA: Diagnosis not present

## 2016-08-19 DIAGNOSIS — Z369 Encounter for antenatal screening, unspecified: Secondary | ICD-10-CM

## 2016-08-19 DIAGNOSIS — O3481 Maternal care for other abnormalities of pelvic organs, first trimester: Secondary | ICD-10-CM | POA: Insufficient documentation

## 2016-08-19 DIAGNOSIS — O99211 Obesity complicating pregnancy, first trimester: Secondary | ICD-10-CM | POA: Insufficient documentation

## 2016-08-19 DIAGNOSIS — Z3682 Encounter for antenatal screening for nuchal translucency: Secondary | ICD-10-CM | POA: Diagnosis not present

## 2016-08-19 DIAGNOSIS — D573 Sickle-cell trait: Secondary | ICD-10-CM

## 2016-08-19 DIAGNOSIS — O99210 Obesity complicating pregnancy, unspecified trimester: Secondary | ICD-10-CM | POA: Insufficient documentation

## 2016-08-19 DIAGNOSIS — Z86718 Personal history of other venous thrombosis and embolism: Secondary | ICD-10-CM

## 2016-08-19 DIAGNOSIS — N83291 Other ovarian cyst, right side: Secondary | ICD-10-CM | POA: Diagnosis not present

## 2016-08-19 DIAGNOSIS — D252 Subserosal leiomyoma of uterus: Secondary | ICD-10-CM

## 2016-08-19 DIAGNOSIS — D219 Benign neoplasm of connective and other soft tissue, unspecified: Secondary | ICD-10-CM | POA: Insufficient documentation

## 2016-08-19 DIAGNOSIS — O10911 Unspecified pre-existing hypertension complicating pregnancy, first trimester: Secondary | ICD-10-CM | POA: Diagnosis not present

## 2016-08-19 DIAGNOSIS — O10011 Pre-existing essential hypertension complicating pregnancy, first trimester: Secondary | ICD-10-CM

## 2016-08-19 DIAGNOSIS — O169 Unspecified maternal hypertension, unspecified trimester: Secondary | ICD-10-CM | POA: Insufficient documentation

## 2016-08-19 DIAGNOSIS — Z8759 Personal history of other complications of pregnancy, childbirth and the puerperium: Principal | ICD-10-CM

## 2016-08-19 DIAGNOSIS — Z6841 Body Mass Index (BMI) 40.0 and over, adult: Secondary | ICD-10-CM | POA: Diagnosis not present

## 2016-08-19 DIAGNOSIS — O10019 Pre-existing essential hypertension complicating pregnancy, unspecified trimester: Secondary | ICD-10-CM

## 2016-08-19 HISTORY — DX: Herpesviral infection of urogenital system, unspecified: A60.00

## 2016-08-19 NOTE — Progress Notes (Addendum)
Aycock, Ngwe A Length of Consultation: 45 minutes   Ms. Eppolito  was referred to [facility] for genetic counseling to review prenatal screening and testing options as well as to discuss her history of sickle cell trait.  In addition, she had a consultation with Dr. Jeneen Rinks regarding her history of a DVT postpartum.  This note summarizes the information we discussed.    We offered the following routine screening tests for this pregnancy:  First trimester screening, which includes nuchal translucency ultrasound screen and first trimester maternal serum marker screening.  The nuchal translucency has approximately an 80% detection rate for Down syndrome and can be positive for other chromosome abnormalities as well as congenital heart defects.  When combined with a maternal serum marker screening, the detection rate is up to 90% for Down syndrome and up to 97% for trisomy 18.     Maternal serum marker screening, a blood test that measures pregnancy proteins, can provide risk assessments for Down syndrome, trisomy 18, and open neural tube defects (spina bifida, anencephaly). Because it does not directly examine the fetus, it cannot positively diagnose or rule out these problems.  Targeted ultrasound uses high frequency sound waves to create an image of the developing fetus.  An ultrasound is often recommended as a routine means of evaluating the pregnancy.  It is also used to screen for fetal anatomy problems (for example, a heart defect) that might be suggestive of a chromosomal or other abnormality.   Should these screening tests indicate an increased concern, then the following additional testing options would be offered:  The chorionic villus sampling procedure is available for first trimester chromosome analysis.  This involves the withdrawal of a small amount of chorionic villi (tissue from the developing placenta).  Risk of pregnancy loss is estimated to be  approximately 1 in 200 to 1 in 100 (0.5 to 1%).  There is approximately a 1% (1 in 100) chance that the CVS chromosome results will be unclear.  Chorionic villi cannot be tested for neural tube defects.     Amniocentesis involves the removal of a small amount of amniotic fluid from the sac surrounding the fetus with the use of a thin needle inserted through the maternal abdomen and uterus.  Ultrasound guidance is used throughout the procedure.  Fetal cells from amniotic fluid are directly evaluated and > 99.5% of chromosome problems and > 98% of open neural tube defects can be detected. This procedure is generally performed after the 15th week of pregnancy.  The main risks to this procedure include complications leading to miscarriage in less than 1 in 200 cases (0.5%).  As another option for information if the pregnancy is suspected to be an an increased chance for certain chromosome conditions, we also reviewed the availability of cell free fetal DNA testing from maternal blood to determine whether or not the baby may have either Down syndrome, trisomy 45, or trisomy 75.  This test utilizes a maternal blood sample and DNA sequencing technology to isolate circulating cell free fetal DNA from maternal plasma.  The fetal DNA can then be analyzed for DNA sequences that are derived from the three most common chromosomes involved in aneuploidy, chromosomes 13, 18, and 21.  If the overall amount of DNA is greater than the expected level for any of these chromosomes, aneuploidy is suspected.  While we do not consider it a replacement for invasive testing and karyotype analysis, a negative result from this testing would be reassuring,  though not a guarantee of a normal chromosome complement for the baby.  An abnormal result is certainly suggestive of an abnormal chromosome complement, though we would still recommend CVS or amniocentesis to confirm any findings from this testing.  Cystic Fibrosis and Spinal Muscular  Atrophy (SMA) screening were also discussed with the patient. Both conditions are recessive, which means that both parents must be carriers in order to have a child with the disease.  Cystic fibrosis (CF) is one of the most common genetic conditions in persons of Caucasian ancestry.  This condition occurs in approximately 1 in 2,500 Caucasian persons and results in thickened secretions in the lungs, digestive, and reproductive systems.  For a baby to be at risk for having CF, both of the parents must be carriers for this condition.  Approximately 1 in 54 Caucasian persons is a carrier for CF.  Current carrier testing looks for the most common mutations in the gene for CF and can detect approximately 90% of carriers in the Caucasian population.  This means that the carrier screening can greatly reduce, but cannot eliminate, the chance for an individual to have a child with CF.  If an individual is found to be a carrier for CF, then carrier testing would be available for the partner. As part of [place] newborn screening profile, all babies born in the state of [place] will have a two-tier screening process.  Specimens are first tested to determine the concentration of immunoreactive trypsinogen (IRT).  The top 5% of specimens with the highest IRT values then undergo DNA testing using a panel of over 40 common CF mutations. SMA is a neurodegenerative disorder that leads to atrophy of skeletal muscle and overall weakness.  This condition is also more prevalent in the Caucasian population, with 1 in 40-1 in 60 persons being a carrier and 1 in 6,000-1 in 10,000 children being affected.  There are multiple forms of the disease, with some causing death in infancy to other forms with survival into adulthood.  The genetics of SMA is complex, but carrier screening can detect up to 95% of carriers in the Caucasian population.  Similar to CF, a negative result can greatly reduce, but cannot eliminate, the chance  to have a child with SMA.  We obtained a detailed family history and pregnancy history.  Ms. Barros reported that she has sickle cell trait.  The father of the baby, Hollice Espy, said that he had been tested and does not have the trait.  We looked for lab results in his medical record, but no hemoglobinopathy testing was there.  He did have a normal MCV (84), which reduces the chance for him to be a carrier for thalassemia.  He had one paternal aunt who passed away due to sickle cell disease and Ms. Line has one paternal aunt with the condition as well.    We reviewed some general information about sickle cell disease and trait.  Sickle cell anemia is caused by a change in the hemoglobin.  Hemoglobin is the substance in red blood cells that carries oxygen.  When there is a change in the structure of the hemoglobin, there are problems in the way the blood carries oxygen.  One specific change causes the cells to take on a sickle, or half moon, shape instead of the usual round shape.  This is called sickle cell anemia.  People with sickle cell disease are at an increased risk for infections, stroke, damage to certain organs, painful crises and  other medical complications.   The changes that can occur in hemoglobin are caused by changes in the genetic instructions, or genes, that tell our bodies how to grow and develop.  We have two copies of all of our genes; one is inherited from the mother and one from the father.  Some diseases are caused when a gene is changed and does not function properly.  Recessive diseases are conditions that are caused when both copies of the gene for a trait do not function properly.  Sickle cell anemia is a recessive condition.  In recessive conditions, a person can have one changed copy of the gene and one normal copy and not have any medical problems.  This is because the normal copy masks the effects of the changed copy.  We call people who have one normal and one changed copy  "carriers" for the trait.  Carriers can pass on either the normal copy or the changed copy when they make eggs or sperm.  This means that there is a 50% chance that the child will inherit either the normal copy or the changed copy.  For a child to have the condition both parents must be carriers and both parents must pass on the changed gene to the child.  When both parents are carriers, they have a 1 in 4 (or 25%) chance of having a child with sickle cell disease, a 1 in 2 chance for the child to have sickle cell trait, and a 1 in 4 chance for the child to not inherit any copies of the changed gene for sickle cell.  These risks are for each pregnancy.   In order to determine if a baby is at risk for inheriting sickle cell disease, both parents must be tested for changes in the genes for hemoglobin using a test called hemoglobin electrophoresis.  The usual sickle cell screening test is not able to tell all changes which may be present in the hemoglobin.  Review of records on Hollice Espy would confirm that he is not a carrier, in which case the baby is not at risk for sickle cell disease, or if he is a carrier, would allow them to know the risk to the baby.  If both parents are carriers, then testing of the current pregnancy is available either prior to or after birth.  Testing prior to birth is performed through an amniocentesis.  We discussed how this procedure is performed and the risk of 1 in 200 for miscarriage from this test.  In addition, we discussed that all newborn babies in [place] are tested for changes in the hemoglobin at birth.  The remainder of the family history was reported to be unremarkable for birth defects, intellectual delays, recurrent pregnancy loss or known chromosome abnormalities.  Ms. Yost reported that this is her second pregnancy, the first with Hollice Espy.  She has a healthy 84 year old son from a prior relationship and he has two daughters, ages 45 and 75, who are also said  to be in good health.  She reported no complications or exposures in this pregnancy that would be expected to increase the risk for birth defects.  After consideration of the options, Ms. Cobb elected to proceed with first trimester screening.  They declined carrier testing for hemoglobinopathies for Hollice Espy and both declined carrier screening for CF and SMA.  An ultrasound was performed at the time of the visit.  The gestational age was consistent with  12 weeks.  Fetal anatomy could  not be assessed due to early gestational age.  Please refer to the ultrasound report for details of that study.  Ms. Randle was encouraged to call with questions or concerns.  We can be contacted at [phone].  I was immediately available and supervising. Erasmo Score, MD [facility]     Wilburt Finlay, MS, [facility]

## 2016-08-19 NOTE — Telephone Encounter (Signed)
[place] on [address].  Labetalol 100mg  2 times a day and lovenox 80mg  once a day.  Printed out and reviewed lovenox teaching/injections.

## 2016-08-19 NOTE — Progress Notes (Signed)
See MFM Consult note from today.

## 2016-08-19 NOTE — Progress Notes (Signed)
[facility]ation   Chief Complaint: Pregnant with history of DVT  HPI: [name] is a 31 y.o. [id number] at 64w2dby [place]ore[place] (unsure LMP)  [facility] pregnancy.  Her additional risk factors at that time were her BMI, OCPs and recent long distance car travel.  She also has HTN.  History of DVT:  With respect to the DVT, she was treated with Xarelto for 6+  months.    HTN: The patient was diagnosed with HTN in 2008.  She was initially prescribed lisinopril, and has taken other medications, but is currently on no medications.    Obstetric History:  Obstetric History   G5   P2   T2   P0   A2   L2    SAB1   TAB0   Ectopic0   Multiple0   Live Births2     # Outcome Date GA Lbr Len/2nd Weight Sex Delivery Anes PTL Lv  5 Current           4 Term 11/14/14   8 lb 12 oz (3.969 kg) M Vag-Spont  N LIV  3 SAB 10/15/12     SAB     2 AB 11/15/06     TAB     1 Term 02/22/05   6 lb 11 oz (3.033 kg) M Vag-Spont  N LIV      Gynecologic History: Unsure of LMP. Patient is pregnant.  [facility] normal History of HSV.  Last outbreak was last year.     Past Medical History: Patient  has a past medical history of [place]; Herpes genitalia; HSV (herpes simplex virus) infection; Hypertension (2014); and Obesity affecting pregnancy. She also has a history of pyelonephritis and was hospitalized in 2014.   Past Surgical History: She  has a past surgical history that includes Cholecystectomy (2007).   Medications:  PN vitamins Was prescribed low dose ASA but has not been taking it.  Allergies: Patient has No Known Allergies.   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  She lives with her fiance and her children.  He is permanently disabled due to a stroke and diabetes.   Patient  reports that she has never smoked. She has never used smokeless tobacco. She reports that she has used  Marijuana. She reports that she does not smoke Marijuana now and does not drink alcohol.   Family History: family history includes Hypertension in her mother.   Review of Systems She feels nauseated all the time and vomits about every 3 days.  She denies lower extremity pain or swelling and denies shortness of breath or chest pain.  Otherwise, a full 12 point review of systems was negative.  Physical Exam: BP (!) 150/78 (BP Location: Right Arm)   Pulse 74   Resp 18   Wt (!) 344 lb (156 kg)   SpO2 98%   BMI 59.05 kg/m   [place]:  The patient is unsure of her LMP so is dated by [place].    The nuchal translucency measured  1.1  mm.    The ovaries were WNL with a  2 cm simple cyst on the right.  A left, fundal and posterior fibroid was noted that measured 5.3 x 6.0 x  6.5 cm.  It appears to be serosal or possibly even pedunculated.      Lab Results  Component Value Date   WBC 9.3 08/05/2016   HGB 12.0 08/05/2016   HCT 34.8 (L) 08/05/2016   MCV 83.2 08/05/2016   PLT 245 08/05/2016    Lab Results  Component Value Date   CREATININE 0.79 08/05/2016   Lab Results  Component Value Date   ALT 84 (H) 08/05/2016   AST 36 08/05/2016   ALKPHOS 44 08/05/2016   BILITOT 0.5 08/05/2016     Asessement/Recommendations::31 yo gravida 5 para 2022 at 68w2dgestation with: 1. History of DVT -- I prescribed Lovenox 80 mg qd for prophylaxis with an adjustment for weight.  She was instructed in its use.   -- This should be held 24 hours before planned delivery and resumed 12 hours after vaginal delivery or 24 hours after cesarean delivery and continued for 6 weeks PP 2. Super morbid obesity - normal baseline labs -- We did not evaluate for sleep apnea today, but this can be done in the future 3. HTN -- I recommended that she start her low-dose ASA -- I prescribed labetalol  100 mg bid -- Full fetal surveillance in the third trimester 4. Uterine fibroid  -- The patient was counseled about the findings of the 6 cm fibroid.   Given the location of the fibroid, she was counseled that it would be unlikely to increase her risk of adverse pregnancy outcome.   -- This can be monitored with future ultrasounds. 5. SCT -- When the patient and her husband met with the genetic counselor today, the patient's husband was offered testing for SCT and decline.   Total time spent with the patient was 40 minutes with greater than 50% spent in counseling and coordination of care.  We appreciate this interesting consult and will be happy to be involved in the ongoing care of [name] anyway her obstetricians desire.  AErasmo Score MD [facility]

## 2016-08-23 ENCOUNTER — Telehealth: Payer: Self-pay | Admitting: Obstetrics and Gynecology

## 2016-08-23 NOTE — Telephone Encounter (Signed)
  Mary Sherman elected to undergo First Trimester screening on 08/19/2016.  To review, first trimester screening, includes nuchal translucency ultrasound screen and/or first trimester maternal serum marker screening.  The nuchal translucency has approximately an 80% detection rate for Down syndrome and can be positive for other chromosome abnormalities as well as heart defects.  When combined with a maternal serum marker screening, the detection rate is up to 90% for Down syndrome and up to 97% for trisomy 13 and 18.     The results of the First Trimester Nuchal Translucency and Biochemical Screening were within normal range.  The risk for Down syndrome is now estimated to be less than 1 in 10,000.  The risk for Trisomy 13/18 is also estimated to be less than 1 in 10,000.  Should more definitive information be desired, we would offer amniocentesis.  Because we do not yet know the effectiveness of combined first and second trimester screening, we do not recommend a maternal serum screen to assess the chance for chromosome conditions.  However, if screening for neural tube defects is desired, maternal serum screening for AFP only can be performed between 15 and [redacted] weeks gestation.    We have been unable to reach [name] by phone with these results, as her phone is not accepting calls.  Please review these results with her at her next OB visit.  We are available at [phone] for any questions or concerns.  Wilburt Finlay, MS, CGC

## 2016-10-01 ENCOUNTER — Encounter: Payer: Self-pay | Admitting: *Deleted

## 2016-10-01 DIAGNOSIS — Z79899 Other long term (current) drug therapy: Secondary | ICD-10-CM | POA: Insufficient documentation

## 2016-10-01 DIAGNOSIS — J020 Streptococcal pharyngitis: Secondary | ICD-10-CM | POA: Diagnosis not present

## 2016-10-01 DIAGNOSIS — I10 Essential (primary) hypertension: Secondary | ICD-10-CM | POA: Diagnosis not present

## 2016-10-01 DIAGNOSIS — H9201 Otalgia, right ear: Secondary | ICD-10-CM | POA: Diagnosis not present

## 2016-10-01 DIAGNOSIS — J029 Acute pharyngitis, unspecified: Secondary | ICD-10-CM | POA: Diagnosis present

## 2016-10-01 NOTE — ED Triage Notes (Signed)
Pt complains of right ear pain and a sore throat, pt is [redacted] weeks pregnant denies any problems

## 2016-10-02 ENCOUNTER — Emergency Department
Admission: EM | Admit: 2016-10-02 | Discharge: 2016-10-02 | Disposition: A | Discharge status: Home / Self Care | Payer: Medicaid Other | Attending: Emergency Medicine | Admitting: Emergency Medicine

## 2016-10-02 DIAGNOSIS — J020 Streptococcal pharyngitis: Secondary | ICD-10-CM

## 2016-10-02 LAB — POCT RAPID STREP A: Streptococcus, Group A Screen (Direct): POSITIVE — AB

## 2016-10-02 MED ORDER — ACETAMINOPHEN 325 MG PO TABS
650.0000 mg | ORAL_TABLET | Freq: Once | ORAL | Status: AC
  Administered 2016-10-02: 650 mg via ORAL
  Filled 2016-10-02: qty 2

## 2016-10-02 MED ORDER — PENICILLIN V POTASSIUM 250 MG PO TABS
500.0000 mg | ORAL_TABLET | Freq: Once | ORAL | Status: AC
  Administered 2016-10-02: 500 mg via ORAL
  Filled 2016-10-02: qty 2

## 2016-10-02 MED ORDER — PENICILLIN V POTASSIUM 500 MG PO TABS: 500.0000 mg | ORAL_TABLET | Freq: Four times a day (QID) | ORAL | 0 refills | Status: DC

## 2016-10-02 NOTE — ED Notes (Signed)
Pt. Going home with friend 

## 2016-10-02 NOTE — ED Provider Notes (Addendum)
[facility] Emergency Department Provider Note   ____________________________________________   None    (approximate)  I have reviewed the triage vital signs and the nursing notes.   HISTORY  Chief Complaint Sore Throat and Otalgia    HPI Mary Sherman is a 31 y.o. female patient complains of sore throat and right earache for about 3 days. She has not been running a fever but she has a headache and feels fuzzyheaded. She is [redacted] weeks pregnant. Sore throat is diffuse. Strep test here in the ER is positive.   Past Medical History:  Diagnosis Date  . DVT (deep venous thrombosis) ([place])   . Herpes genitalia    no current outbreaks  . HSV (herpes simplex virus) infection   . Hypertension 2014  . Obesity affecting pregnancy     Patient Active Problem List   Diagnosis Date Noted  . Antepartum hypertension 08/19/2016  . History of maternal deep vein thrombosis (DVT) 08/19/2016  . Obesity affecting pregnancy 08/19/2016  . Fibroid 08/19/2016  . First trimester screening   . Sickle cell trait [facility])     Past Surgical History:  Procedure Laterality Date  . CHOLECYSTECTOMY  2007    Prior to Admission medications   Medication Sig Start Date End Date Taking? Authorizing Provider  chlorothiazide (DIURIL) 250 MG tablet Take 250 mg by mouth every morning. Reported on 09/04/2015    [provider]  penicillin v potassium (VEETID) 500 MG tablet Take 1 tablet (500 mg total) by mouth 4 (four) times daily. 10/02/16   Nena Polio, MD  Prenatal Vit-Fe Fumarate-FA (MULTIVITAMIN-PRENATAL) 27-0.8 MG TABS tablet Take 1 tablet by mouth daily at 12 noon.    [provider]  rivaroxaban (XARELTO) 20 MG TABS tablet Take 20 mg by mouth daily with supper.    [provider]    Allergies Patient has no known allergies.  Family History  Problem Relation Age of Onset  . Hypertension Mother   . Hypertension Sister     Social  History Social History  Substance Use Topics  . Smoking status: Never Smoker  . Smokeless tobacco: Never Used  . Alcohol use No    Review of Systems  Constitutional: No fever/chills Eyes: No visual changes. ENT:  sore throat. Cardiovascular: Denies chest pain. Respiratory: Denies shortness of breath. Gastrointestinal: No abdominal pain.  No nausea, no vomiting.  No diarrhea.  No constipation. Genitourinary: Negative for dysuria. Musculoskeletal: Negative for back pain. Skin: Negative for rash. Neurological: Negative for focal weakness or numbness.  ____________________________________________   PHYSICAL EXAM:  VITAL SIGNS: ED Triage Vitals  Enc Vitals Group     BP 10/01/16 2356 (!) 152/78     Pulse Rate 10/01/16 2356 86     Resp 10/01/16 2356 20     Temp 10/01/16 2356 97.6 F (36.4 C)     Temp Source 10/01/16 2356 Oral     SpO2 10/01/16 2356 100 %     Weight 10/01/16 2356 (!) 353 lb (160.1 kg)     Height 10/01/16 2356 5\' 4"  (1.626 m)     Head Circumference --      Peak Flow --      Pain Score 10/01/16 2355 9     Pain Loc --      Pain Edu? --      Excl. in [place]? --     Constitutional: Alert and oriented. Well appearing and in no acute distress. Eyes: Conjunctivae are normal. PERRL.  EOMI. Head: Atraumatic. Nose: No congestion/rhinnorhea. Mouth/Throat: Mucous membranes are moist.  Oropharynx Slightly erythematous Neck: No stridor Cardiovascular: Normal rate, regular rhythm. Grossly normal heart sounds.  Good peripheral circulation. Respiratory: Normal respiratory effort.  No retractions. Lungs CTAB. Gastrointestinal: Soft and nontender. No distention. No abdominal bruits. No CVA tenderness. Musculoskeletal: No lower extremity tenderness nor edema.  No joint effusions. Neurologic:  Normal speech and language. No gross focal neurologic deficits are appreciated. Skin:  Skin is warm, dry and intact. No rash noted. Psychiatric: Mood and affect are normal. Speech and  behavior are normal.  ____________________________________________   LABS (all labs ordered are listed, but only abnormal results are displayed)  Labs Reviewed  POCT RAPID STREP A - Abnormal; Notable for the following:       Result Value   Streptococcus, Group A Screen (Direct) POSITIVE (*)    All other components within normal limits   ____________________________________________  EKG   ____________________________________________  RADIOLOGY   ____________________________________________   PROCEDURES  Procedure(s) performed:   Procedures  Critical Care performed:   ____________________________________________   INITIAL IMPRESSION / ASSESSMENT AND PLAN / ED COURSE  Pertinent labs & imaging results that were available during my care of the patient were reviewed by me and considered in my medical decision making (see chart for details).        ____________________________________________   FINAL CLINICAL IMPRESSION(S) / ED DIAGNOSES  Final diagnoses:  Strep pharyngitis      NEW MEDICATIONS STARTED DURING THIS VISIT:  New Prescriptions   PENICILLIN V POTASSIUM (VEETID) 500 MG TABLET    Take 1 tablet (500 mg total) by mouth 4 (four) times daily.     Note:  This document was prepared using Dragon voice recognition software and may include unintentional dictation errors.    Nena Polio, MD 10/02/16 5400    Nena Polio, MD 10/02/16 [phone]

## 2016-10-02 NOTE — Discharge Instructions (Signed)
Use Tylenol as needed for the headache and any fever he might develop. Take penicillin 1 pill 4 times a day. If you skip a dose don't worry about it if you skip 2 doses take pills  2 together. Please return here or see her doctor if you're worse or no better in 2 or 3 days. Worse includes fever or worse headache vomiting or feeling sicker.

## 2016-10-02 NOTE — ED Notes (Signed)
Pt complains of sore throat for 3 days. Pt with reddened tonsils, white patch noted to right tonsil.

## 2016-12-31 ENCOUNTER — Observation Stay
Admission: EM | Admit: 2016-12-31 | Discharge: 2016-12-31 | Disposition: A | Discharge status: Home / Self Care | Payer: Medicaid Other | Attending: Obstetrics & Gynecology | Admitting: Obstetrics & Gynecology

## 2016-12-31 DIAGNOSIS — R1013 Epigastric pain: Secondary | ICD-10-CM | POA: Diagnosis present

## 2016-12-31 DIAGNOSIS — O99211 Obesity complicating pregnancy, first trimester: Secondary | ICD-10-CM

## 2016-12-31 DIAGNOSIS — D252 Subserosal leiomyoma of uterus: Secondary | ICD-10-CM

## 2016-12-31 DIAGNOSIS — K259 Gastric ulcer, unspecified as acute or chronic, without hemorrhage or perforation: Secondary | ICD-10-CM

## 2016-12-31 DIAGNOSIS — Z86718 Personal history of other venous thrombosis and embolism: Secondary | ICD-10-CM

## 2016-12-31 DIAGNOSIS — Z8759 Personal history of other complications of pregnancy, childbirth and the puerperium: Secondary | ICD-10-CM

## 2016-12-31 DIAGNOSIS — O10019 Pre-existing essential hypertension complicating pregnancy, unspecified trimester: Secondary | ICD-10-CM

## 2016-12-31 LAB — CBC
HCT: 33.7 % — ABNORMAL LOW (ref 35.0–47.0)
Hemoglobin: 11.6 g/dL — ABNORMAL LOW (ref 12.0–16.0)
MCH: 28.3 pg (ref 26.0–34.0)
MCHC: 34.5 g/dL (ref 32.0–36.0)
MCV: 81.9 fL (ref 80.0–100.0)
PLATELETS: 226 10*3/uL (ref 150–440)
RBC: 4.12 MIL/uL (ref 3.80–5.20)
RDW: 15 % — AB (ref 11.5–14.5)
WBC: 8.1 10*3/uL (ref 3.6–11.0)

## 2016-12-31 LAB — LIPASE, BLOOD: LIPASE: 20 U/L (ref 11–51)

## 2016-12-31 LAB — COMPREHENSIVE METABOLIC PANEL
ALT: 25 U/L (ref 14–54)
AST: 15 U/L (ref 15–41)
Albumin: 2.8 g/dL — ABNORMAL LOW (ref 3.5–5.0)
Alkaline Phosphatase: 59 U/L (ref 38–126)
Anion gap: 8 (ref 5–15)
BUN: 7 mg/dL (ref 6–20)
CHLORIDE: 105 mmol/L (ref 101–111)
CO2: 24 mmol/L (ref 22–32)
CREATININE: 0.82 mg/dL (ref 0.44–1.00)
Calcium: 9 mg/dL (ref 8.9–10.3)
GFR calc non Af Amer: >60 mL/min (ref >60)
Glucose, Bld: 88 mg/dL (ref 65–99)
Potassium: 3.7 mmol/L (ref 3.5–5.1)
SODIUM: 137 mmol/L (ref 135–145)
TOTAL PROTEIN: 6.6 g/dL (ref 6.5–8.1)
Total Bilirubin: 0.2 mg/dL — ABNORMAL LOW (ref 0.3–1.2)

## 2016-12-31 LAB — PROTEIN / CREATININE RATIO, URINE
Creatinine, Urine: 67 mg/dL
PROTEIN CREATININE RATIO: 0.1 mg/mg{creat} (ref 0.00–0.15)
TOTAL PROTEIN, URINE: 7 mg/dL

## 2016-12-31 MED ORDER — ONDANSETRON HCL 4 MG PO TABS
8.0000 mg | ORAL_TABLET | Freq: Once | ORAL | Status: AC
  Administered 2016-12-31: 8 mg via ORAL

## 2016-12-31 MED ORDER — GI COCKTAIL ~~LOC~~
30.0000 mL | Freq: Once | ORAL | Status: AC
  Administered 2016-12-31: 30 mL via ORAL
  Filled 2016-12-31 (×2): qty 30

## 2016-12-31 MED ORDER — LABETALOL HCL 5 MG/ML IV SOLN: 20.0000 mg | INTRAVENOUS | Status: DC | PRN

## 2016-12-31 MED ORDER — HYDRALAZINE HCL 20 MG/ML IJ SOLN
10.0000 mg | Freq: Once | INTRAMUSCULAR | Status: DC | PRN
Start: 2016-12-31 — End: 2016-12-31

## 2016-12-31 MED ORDER — ONDANSETRON HCL 4 MG PO TABS
ORAL_TABLET | ORAL | Status: AC
  Filled 2016-12-31: qty 2

## 2016-12-31 MED ORDER — GI COCKTAIL ~~LOC~~
30.0000 mL | Freq: Once | ORAL | Status: AC
  Administered 2016-12-31: 30 mL via ORAL
  Filled 2016-12-31: qty 30

## 2016-12-31 MED ORDER — RANITIDINE HCL 150 MG PO TABS: 150.0000 mg | ORAL_TABLET | Freq: Two times a day (BID) | ORAL | 1 refills | Status: DC

## 2016-12-31 NOTE — Discharge Instructions (Signed)
Heartburn Heartburn is a type of pain or discomfort that can happen in the throat or chest. It is often described as a burning pain. It may also cause a bad taste in the mouth. Heartburn may feel worse when you lie down or bend over. It may be caused by stomach contents that move back up (reflux) into the tube that connects the mouth with the stomach (esophagus). Follow these instructions at home: Take these actions to lessen your discomfort and to help avoid problems. Diet  Follow a diet as told by your doctor. You may need to avoid foods and drinks such as: ? Coffee and tea (with or without caffeine). ? Drinks that contain alcohol. ? Energy drinks and sports drinks. ? Carbonated drinks or sodas. ? Chocolate and cocoa. ? Peppermint and mint flavorings. ? Garlic and onions. ? Horseradish. ? Spicy and acidic foods, such as peppers, chili powder, curry powder, vinegar, hot sauces, and BBQ sauce. ? Citrus fruit juices and citrus fruits, such as oranges, lemons, and limes. ? Tomato-based foods, such as red sauce, chili, salsa, and pizza with red sauce. ? Fried and fatty foods, such as donuts, french fries, potato chips, and high-fat dressings. ? High-fat meats, such as hot dogs, rib eye steak, sausage, ham, and bacon. ? High-fat dairy items, such as whole milk, butter, and cream cheese.  Eat small meals often. Avoid eating large meals.  Avoid drinking large amounts of liquid with your meals.  Avoid eating meals during the 2-3 hours before bedtime.  Avoid lying down right after you eat.  Do not exercise right after you eat. General instructions  Pay attention to any changes in your symptoms.  Take over-the-counter and prescription medicines only as told by your doctor. Do not take aspirin, ibuprofen, or other NSAIDs unless your doctor says it is okay.  Do not use any tobacco products, including cigarettes, chewing tobacco, and e-cigarettes. If you need help quitting, ask your  doctor.  Wear loose clothes. Do not wear anything tight around your waist.  Raise (elevate) the head of your bed about 6 inches (15 cm).  Try to lower your stress. If you need help doing this, ask your doctor.  If you are overweight, lose an amount of weight that is healthy for you. Ask your doctor about a safe weight loss goal.  Keep all follow-up visits as told by your doctor. This is important. Contact a doctor if:  You have new symptoms.  You lose weight and you do not know why it is happening.  You have trouble swallowing, or it hurts to swallow.  You have wheezing or a cough that keeps happening.  Your symptoms do not get better with treatment.  You have heartburn often for more than two weeks. Get help right away if:  You have pain in your arms, neck, jaw, teeth, or back.  You feel sweaty, dizzy, or light-headed.  You have chest pain or shortness of breath.  You throw up (vomit) and your throw up looks like blood or coffee grounds.  Your poop (stool) is bloody or black. This information is not intended to replace advice given to you by your health care provider. Make sure you discuss any questions you have with your health care provider. Document Released: 12/30/2010 Document Revised: 09/25/2015 Document Reviewed: 08/14/2014 Elsevier Interactive Patient Education  2018 [employer]. [employer] of the uterus can occur throughout pregnancy, but they are not always a sign that you are in labor. You  may have practice contractions called Braxton Hicks contractions. These false labor contractions are sometimes confused with true labor. What are [place] contractions? Braxton Hicks contractions are tightening movements that occur in the muscles of the uterus before labor. Unlike true labor contractions, these contractions do not result in opening (dilation) and thinning of the cervix. Toward the end of pregnancy (32-34 weeks), Braxton Hicks  contractions can happen more often and may become stronger. These contractions are sometimes difficult to tell apart from true labor because they can be very uncomfortable. You should not feel embarrassed if you go to the hospital with false labor. Sometimes, the only way to tell if you are in true labor is for your health care provider to look for changes in the cervix. The health care provider will do a physical exam and may monitor your contractions. If you are not in true labor, the exam should show that your cervix is not dilating and your water has not broken. If there are no prenatal problems or other health problems associated with your pregnancy, it is completely safe for you to be sent home with false labor. You may continue to have Braxton Hicks contractions until you go into true labor. How can I tell the difference between true labor and false labor?  Differences ? False labor ? Contractions last 30-70 seconds.: Contractions are usually shorter and not as strong as true labor contractions. ? Contractions become very regular.: Contractions are usually irregular. ? Discomfort is usually felt in the top of the uterus, and it spreads to the lower abdomen and low back.: Contractions are often felt in the front of the lower abdomen and in the groin. ? Contractions do not go away with walking.: Contractions may go away when you walk around or change positions while lying down. ? Contractions usually become more intense and increase in frequency.: Contractions get weaker and are shorter-lasting as time goes on. ? The cervix dilates and gets thinner.: The cervix usually does not dilate or become thin. Follow these instructions at home:  Take over-the-counter and prescription medicines only as told by your health care provider.  Keep up with your usual exercises and follow other instructions from your health care provider.  Eat and drink lightly if you think you are going into labor.  If  Braxton Hicks contractions are making you uncomfortable: ? Change your position from lying down or resting to walking, or change from walking to resting. ? Sit and rest in a tub of warm water. ? Drink enough fluid to keep your urine clear or pale yellow. Dehydration may cause these contractions. ? Do slow and deep breathing several times an hour.  Keep all follow-up prenatal visits as told by your health care provider. This is important. Contact a health care provider if:  You have a fever.  You have continuous pain in your abdomen. Get help right away if:  Your contractions become stronger, more regular, and closer together.  You have fluid leaking or gushing from your vagina.  You pass blood-tinged mucus (bloody show).  You have bleeding from your vagina.  You have low back pain that you never had before.  You feel your babys head pushing down and causing pelvic pressure.  Your baby is not moving inside you as much as it used to. Summary  Contractions that occur before labor are called Braxton Hicks contractions, false labor, or practice contractions.  Braxton Hicks contractions are usually shorter, weaker, farther apart, and less regular than  true labor contractions. True labor contractions usually become progressively stronger and regular and they become more frequent.  Manage discomfort from [facility] contractions by changing position, resting in a warm bath, drinking plenty of water, or practicing deep breathing. This information is not intended to replace advice given to you by your health care provider. Make sure you discuss any questions you have with your health care provider. Document Released: 04/19/2005 Document Revised: 03/08/2016 Document Reviewed: 03/08/2016 Elsevier Interactive Patient Education  2017 [employer].

## 2016-12-31 NOTE — Progress Notes (Signed)
Pt vomiting just after swallowing GI cocktail. Will give zofran and readminister.

## 2016-12-31 NOTE — ED Triage Notes (Signed)
TRIAGE VISIT with NST   Mary Sherman is a 31 y.o. [id number]. She is at [redacted]w[redacted]d gestation. She is a patient of [facility] high risk  Indication: Sharp mid epigastric abdominal pain 7-8/10 that started acutely last night at about 9pm after eating spicy jambalaya. Never had a pain like this before, some nausea but no vomiting, no improvement with bowel movement, which was normal overnight. Baby moving well, no contractions, LOF or VB.   - s/p cholesytectomy at age 8yo - hx of DVT in right calf after last delivery, currently on 80mg  Lovenox daily.  - hx of chronic HTN, no meds through pregnancy. Blood pressure yesterday in the office 120s/80s. - no hx of PE, PreE, diabetes - No prior surgeries other than gall bladder - Hx of NSVD x2, no delivery complications other than above  She denies SOB, palpitations, LLQ pain, pain with movement. The pain is worse with pressure over the site but not worse with moving her belly.  O:  BP (!) 139/96 (BP Location: Left Arm) Comment (BP Location): FA  Pulse 68   Temp 98 F (36.7 C) (Oral)   Resp 16   Ht 5\' 4"  (1.626 m)   Wt (!) 363 lb (164.7 kg)   BMI 62.31 kg/m  No results found for this or any previous visit (from the past 48 hour(s)).   Gen: NAD, AAOx3      Abd: No fundal tenderness, no RLQ Pain with palpation, no RUQ pain with palpation. Significant tenderness directly in midline with pressure. CV: RRR Pulm: CTAB in 6 lungs fields      Ext: Non-tender, Nonedmeatous, homan's neg   FHT: 130, mod var, +accels, no decels TOCO: quiet SVE:  deferred   A/P:  31 y.o. [id number] [redacted]w[redacted]d with mid epigastric pain, most likely gastric ulcer. Her BP is elevated to mild range, which goes along with her pain. Other ddx much less likely include DVT, fatty liver of pregnancy. I am reassured that she is not tachypnic, tachycardic, or vomiting.   GI cocktail given. We will see if improvement. If so, d/c home on protonix or zantac and f/u as planned.  If not, consider  MRI  PreE labs pending  Fetal Wellbeing: NST is Reassuring Cat 1 tracing.

## 2016-12-31 NOTE — OB Triage Note (Addendum)
Pt arrived to triage with c/o abdominal pain starting around 9pm last evening, worsening since that time.  Pt states she is feeling baby move normally and denies vaginal bleeding and leaking of fluid.  Denies HA and blurred vision.  EFM and toco applied and tracing.

## 2016-12-31 NOTE — Discharge Summary (Signed)
See triage note  Pain improved with GI cocktail. PreE labs all wnl. Cat I strip persistently.   D/C home with zantac script, f/u with OB as scheduled  Body mass index is 62.31 kg/m. Pt aware that she must deliver at [facility] and that we are unable to assure her safety at our regional hospital.  Work note for today written.

## 2017-05-24 ENCOUNTER — Encounter (HOSPITAL_COMMUNITY): Payer: Self-pay

## 2018-01-25 ENCOUNTER — Other Ambulatory Visit: Payer: Self-pay

## 2018-01-25 ENCOUNTER — Encounter: Payer: Self-pay | Admitting: *Deleted

## 2018-01-25 ENCOUNTER — Emergency Department: Payer: Medicaid Other

## 2018-01-25 ENCOUNTER — Emergency Department
Admission: EM | Admit: 2018-01-25 | Discharge: 2018-01-25 | Disposition: A | Discharge status: Home / Self Care | Payer: Medicaid Other | Attending: Emergency Medicine | Admitting: Emergency Medicine

## 2018-01-25 DIAGNOSIS — F1210 Cannabis abuse, uncomplicated: Secondary | ICD-10-CM | POA: Insufficient documentation

## 2018-01-25 DIAGNOSIS — Z7901 Long term (current) use of anticoagulants: Secondary | ICD-10-CM | POA: Insufficient documentation

## 2018-01-25 DIAGNOSIS — M25462 Effusion, left knee: Secondary | ICD-10-CM | POA: Diagnosis not present

## 2018-01-25 DIAGNOSIS — M1712 Unilateral primary osteoarthritis, left knee: Secondary | ICD-10-CM

## 2018-01-25 DIAGNOSIS — M25562 Pain in left knee: Secondary | ICD-10-CM | POA: Diagnosis present

## 2018-01-25 DIAGNOSIS — Z79899 Other long term (current) drug therapy: Secondary | ICD-10-CM | POA: Insufficient documentation

## 2018-01-25 DIAGNOSIS — I10 Essential (primary) hypertension: Secondary | ICD-10-CM | POA: Insufficient documentation

## 2018-01-25 MED ORDER — MELOXICAM 15 MG PO TABS: 15.0000 mg | ORAL_TABLET | Freq: Every day | ORAL | 0 refills | Status: DC

## 2018-01-25 MED ORDER — HYDROCODONE-ACETAMINOPHEN 5-325 MG PO TABS: 1.0000 | ORAL_TABLET | Freq: Four times a day (QID) | ORAL | 0 refills | Status: AC | PRN

## 2018-01-25 NOTE — Discharge Instructions (Signed)
Please elevate your left leg as often as possible for the next 3 days. Your symptoms will not improve with long hours of ambulation or standing. Follow up with orthopedics if not improving over the week. Return to the ER for symptoms that change or worsen if unable to schedule an appointment.

## 2018-01-25 NOTE — ED Provider Notes (Signed)
[facility] Emergency Department Provider Note ____________________________________________  Time seen: Approximately 8:46 PM  I have reviewed the triage vital signs and the nursing notes.   HISTORY  Chief Complaint Knee Pain    HPI LATONYIA LOPATA is a 32 y.o. female who presents to the emergency department for evaluation and treatment of left knee pain after falling in the bathtub 2 months ago. No relief with OTC medications. Past Medical History:  Diagnosis Date  . DVT (deep venous thrombosis) ([place])   . Herpes genitalia    no current outbreaks  . HSV (herpes simplex virus) infection   . Hypertension 2014  . Obesity affecting pregnancy     Patient Active Problem List   Diagnosis Date Noted  . Antepartum hypertension 08/19/2016  . History of maternal deep vein thrombosis (DVT) 08/19/2016  . Obesity affecting pregnancy 08/19/2016  . Fibroid 08/19/2016  . First trimester screening   . Sickle cell trait [facility])     Past Surgical History:  Procedure Laterality Date  . CHOLECYSTECTOMY  2007    Prior to Admission medications   Medication Sig Start Date End Date Taking? Authorizing Provider  chlorothiazide (DIURIL) 250 MG tablet Take 250 mg by mouth every morning. Reported on 09/04/2015    [provider]  enoxaparin (LOVENOX) 80 MG/0.8ML injection Inject 80 mg into the skin daily.    [provider]  HYDROcodone-acetaminophen (NORCO/VICODIN) 5-325 MG tablet Take 1 tablet by mouth every 6 (six) hours as needed for up to 3 days for severe pain. 01/25/18 01/28/18  Jalilah Wiltsie, Johnette Abraham B, FNP  meloxicam (MOBIC) 15 MG tablet Take 1 tablet (15 mg total) by mouth daily. 01/25/18   Markita Stcharles, Johnette Abraham B, FNP  penicillin v potassium (VEETID) 500 MG tablet Take 1 tablet (500 mg total) by mouth 4 (four) times daily. Patient not taking: Reported on 12/31/2016 10/02/16   Nena Polio, MD  Prenatal Vit-Fe Fumarate-FA (MULTIVITAMIN-PRENATAL) 27-0.8 MG TABS  tablet Take 1 tablet by mouth daily at 12 noon.    [provider]  ranitidine (ZANTAC) 150 MG tablet Take 1 tablet (150 mg total) by mouth 2 (two) times daily. 12/31/16 12/31/17  Benjaman Kindler, MD  rivaroxaban (XARELTO) 20 MG TABS tablet Take 20 mg by mouth daily with supper.    [provider]    Allergies Patient has no known allergies.  Family History  Problem Relation Age of Onset  . Hypertension Mother   . Hypertension Sister     Social History Social History   Tobacco Use  . Smoking status: Never Smoker  . Smokeless tobacco: Never Used  Substance Use Topics  . Alcohol use: No  . Drug use: Yes    Types: Marijuana    Comment: not during pregnancy    Review of Systems Constitutional: Negative for fever. Cardiovascular: Negative for chest pain. Respiratory: Negative for shortness of breath. Musculoskeletal: Positive for left knee pain. Skin: Negative for open wound or lesion.  Neurological: Negative for decrease in sensation  ____________________________________________   PHYSICAL EXAM:  VITAL SIGNS: ED Triage Vitals  Enc Vitals Group     BP 01/25/18 2008 (!) 155/82     Pulse Rate 01/25/18 2008 88     Resp 01/25/18 2008 20     Temp 01/25/18 2008 99.1 F (37.3 C)     Temp Source 01/25/18 2008 Oral     SpO2 01/25/18 2008 99 %     Weight 01/25/18 2006 (!) 360 lb (163.3 kg)  Height 01/25/18 2006 5\' 4"  (1.626 m)     Head Circumference --      Peak Flow --      Pain Score 01/25/18 2006 10     Pain Loc --      Pain Edu? --      Excl. in [place]? --     Constitutional: Alert and oriented. Well appearing and in no acute distress. Eyes: Conjunctivae are clear without discharge or drainage Head: Atraumatic Neck: Supple Respiratory: No cough. Respirations are even and unlabored. Musculoskeletal: Left knee tender to palpation over the patella area. Limited flexion of the knee due to pain. Joint is stable on exam.  Neurologic: Motor and  sensation is intact.  Skin: Intact.  Psychiatric: Affect and behavior are appropriate.  ____________________________________________   LABS (all labs ordered are listed, but only abnormal results are displayed)  Labs Reviewed - No data to display ____________________________________________  RADIOLOGY  Image of the left knee is negative for acute bony abnormality per radiology. ____________________________________________   PROCEDURES  Procedures  ____________________________________________   INITIAL IMPRESSION / ASSESSMENT AND PLAN / ED COURSE  LANNAH KOIKE is a 32 y.o. who presents to the emergency department for treatment and evaluation of left knee pain.  Patient has experienced pain for the past 2 months.  Image and exam are reassuring.  She will be prescribed meloxicam and Norco. She is to follow up with orthopedics for symptoms that are not improving over the week.  She will be provided a work excuse for the next 3 days.  She was instructed to return to the emergency department for symptoms of change or worsen if she is unable see her primary care provider or the specialist.  Medications - No data to display  Pertinent labs & imaging results that were available during my care of the patient were reviewed by me and considered in my medical decision making (see chart for details).  _________________________________________   FINAL CLINICAL IMPRESSION(S) / ED DIAGNOSES  Final diagnoses:  Effusion of left knee  Osteoarthritis of left knee, unspecified osteoarthritis type    ED Discharge Orders         Ordered    meloxicam (MOBIC) 15 MG tablet  Daily     01/25/18 2212    HYDROcodone-acetaminophen (NORCO/VICODIN) 5-325 MG tablet  Every 6 hours PRN     01/25/18 2212           If controlled substance prescribed during this visit, 12 month history viewed on the [place] prior to issuing an initial prescription for Schedule II or III opiod.    Victorino Dike, FNP 01/25/18 2317    Nance Pear, MD 01/25/18 [phone]

## 2018-01-25 NOTE — ED Triage Notes (Signed)
Pt fell in the bathtub 2 months ago.  Pt taking otc meds without pain relief.  Pt has left knee pain.

## 2018-05-18 ENCOUNTER — Encounter: Payer: Self-pay | Admitting: Emergency Medicine

## 2018-05-18 ENCOUNTER — Emergency Department: Payer: Self-pay

## 2018-05-18 ENCOUNTER — Emergency Department
Admission: EM | Admit: 2018-05-18 | Discharge: 2018-05-18 | Disposition: A | Discharge status: Home / Self Care | Payer: Self-pay | Attending: Emergency Medicine | Admitting: Emergency Medicine

## 2018-05-18 DIAGNOSIS — Z9049 Acquired absence of other specified parts of digestive tract: Secondary | ICD-10-CM | POA: Insufficient documentation

## 2018-05-18 DIAGNOSIS — H1032 Unspecified acute conjunctivitis, left eye: Secondary | ICD-10-CM

## 2018-05-18 DIAGNOSIS — H1089 Other conjunctivitis: Secondary | ICD-10-CM | POA: Insufficient documentation

## 2018-05-18 DIAGNOSIS — Z7901 Long term (current) use of anticoagulants: Secondary | ICD-10-CM | POA: Insufficient documentation

## 2018-05-18 DIAGNOSIS — Z79899 Other long term (current) drug therapy: Secondary | ICD-10-CM | POA: Insufficient documentation

## 2018-05-18 DIAGNOSIS — J209 Acute bronchitis, unspecified: Secondary | ICD-10-CM | POA: Insufficient documentation

## 2018-05-18 DIAGNOSIS — D573 Sickle-cell trait: Secondary | ICD-10-CM | POA: Insufficient documentation

## 2018-05-18 DIAGNOSIS — I10 Essential (primary) hypertension: Secondary | ICD-10-CM | POA: Insufficient documentation

## 2018-05-18 LAB — INFLUENZA PANEL BY PCR (TYPE A & B)
INFLBPCR: NEGATIVE
Influenza A By PCR: NEGATIVE

## 2018-05-18 MED ORDER — TOBRAMYCIN 0.3 % OP SOLN: 2.0000 [drp] | OPHTHALMIC | 0 refills | Status: DC

## 2018-05-18 MED ORDER — BENZONATATE 200 MG PO CAPS: 200.0000 mg | ORAL_CAPSULE | Freq: Three times a day (TID) | ORAL | 0 refills | Status: DC | PRN

## 2018-05-18 MED ORDER — AZITHROMYCIN 250 MG PO TABS: ORAL_TABLET | ORAL | 0 refills | Status: DC

## 2018-05-18 NOTE — ED Triage Notes (Signed)
Pt reports left eye drainage and states in the am when she wakes up it looks like someone has put snot in her eye. Pt reports eye itches also.

## 2018-05-18 NOTE — ED Notes (Signed)
See triage note  Presents with left eye pain and drainage for the past couple of days  Also states she is having headache to same side

## 2018-05-18 NOTE — Discharge Instructions (Addendum)
Follow-up with your regular doctor for a blood pressure recheck.  Your blood pressure was very elevated today.  Take the cough medicine as prescribed.  Take the antibiotic and antibiotic drops as prescribed.  You should remain out of work until you have had 24 hours of eyedrops.  You are contagious at this time.  Wash your hands after touching your eyes.  Return to emergency department if worsening.

## 2018-05-18 NOTE — ED Provider Notes (Signed)
[facility] Emergency Department Provider Note  ____________________________________________   First MD Initiated Contact with Patient 05/18/18 1427     (approximate)  I have reviewed the triage vital signs and the nursing notes.   HISTORY  Chief Complaint Eye Drainage and Conjunctivitis    HPI Mary Sherman is a 33 y.o. female presents emergency department with flulike symptoms, patient  complained of fever, chills, body aches. cough, denies sore throat, denies vomiting, denies diarrhea; denies chest pain or sob.  Sx for 2 days Patient also is complaining of left eye drainage and matting.  Past Medical History:  Diagnosis Date  . DVT (deep venous thrombosis) ([place])   . Herpes genitalia    no current outbreaks  . HSV (herpes simplex virus) infection   . Hypertension 2014  . Obesity affecting pregnancy     Patient Active Problem List   Diagnosis Date Noted  . Antepartum hypertension 08/19/2016  . History of maternal deep vein thrombosis (DVT) 08/19/2016  . Obesity affecting pregnancy 08/19/2016  . Fibroid 08/19/2016  . First trimester screening   . Sickle cell trait [facility])     Past Surgical History:  Procedure Laterality Date  . CHOLECYSTECTOMY  2007    Prior to Admission medications   Medication Sig Start Date End Date Taking? Authorizing Provider  azithromycin (ZITHROMAX Z-PAK) 250 MG tablet 2 pills today then 1 pill a day for 4 days 05/18/18   Caryn Section, Linden Dolin, PA-C  benzonatate (TESSALON) 200 MG capsule Take 1 capsule (200 mg total) by mouth 3 (three) times daily as needed for cough. 05/18/18   Fisher, Linden Dolin, PA-C  chlorothiazide (DIURIL) 250 MG tablet Take 250 mg by mouth every morning. Reported on 09/04/2015    [provider]  ranitidine (ZANTAC) 150 MG tablet Take 1 tablet (150 mg total) by mouth 2 (two) times daily. 12/31/16 12/31/17  Benjaman Kindler, MD  rivaroxaban (XARELTO) 20 MG TABS tablet Take 20 mg by mouth daily  with supper.    [provider]  tobramycin (TOBREX) 0.3 % ophthalmic solution Place 2 drops into both eyes every 4 (four) hours. 05/18/18   Versie Starks, PA-C    Allergies Patient has no known allergies.  Family History  Problem Relation Age of Onset  . Hypertension Mother   . Hypertension Sister     Social History Social History   Tobacco Use  . Smoking status: Never Smoker  . Smokeless tobacco: Never Used  Substance Use Topics  . Alcohol use: No  . Drug use: Yes    Types: Marijuana    Comment: not during pregnancy    Review of Systems  Constitutional: Positive fever/chills Eyes: No visual changes.  Positive for left eye redness and drainage along with matting ENT: No sore throat. Respiratory: Positive cough Genitourinary: Negative for dysuria. Musculoskeletal: Negative for back pain. Skin: Negative for rash.    ____________________________________________   PHYSICAL EXAM:  VITAL SIGNS: ED Triage Vitals [05/18/18 1303]  Enc Vitals Group     BP (!) 172/113     Pulse Rate 65     Resp 20     Temp 97.6 F (36.4 C)     Temp Source Oral     SpO2 99 %     Weight (!) 350 lb (158.8 kg)     Height 5\' 4"  (1.626 m)     Head Circumference      Peak Flow      Pain Score 3  Pain Loc      Pain Edu?      Excl. in [place]?     Constitutional: Alert and oriented. Well appearing and in no acute distress. Eyes: Conjunctiva of the left eye are injected Head: Atraumatic. Nose: No congestion/rhinnorhea. Mouth/Throat: Mucous membranes are moist.   Neck:  supple no lymphadenopathy noted Cardiovascular: Normal rate, regular rhythm. Heart sounds are normal Respiratory: Normal respiratory effort.  No retractions, lungs c t a, cough is wet GU: deferred Musculoskeletal: FROM all extremities, warm and well perfused Neurologic:  Normal speech and language.  Skin:  Skin is warm, dry and intact. No rash noted. Psychiatric: Mood and affect are normal. Speech and  behavior are normal.  ____________________________________________   LABS (all labs ordered are listed, but only abnormal results are displayed)  Labs Reviewed  INFLUENZA PANEL BY PCR (TYPE A & B)   ____________________________________________   ____________________________________________  RADIOLOGY  Chest x-ray is negative  ____________________________________________   PROCEDURES  Procedure(s) performed: No  Procedures    ____________________________________________   INITIAL IMPRESSION / ASSESSMENT AND PLAN / ED COURSE  Pertinent labs & imaging results that were available during my care of the patient were reviewed by me and considered in my medical decision making (see chart for details).   Patient is a 33 year old female presents emergency department flulike symptoms.  Physical exam shows injected conjunctiva of the left eye with some exudate, harsh cough.  Remainder the exam is unremarkable  Chest x-ray is negative Flu test is negative  Explained to the patient that she has acute conjunctivitis of bronchitis.  She was given a prescription for Z-Pak, tobramycin ophthalmic drops, and Tessalon Perles.  She is to follow-up with her regular doctor if not better in 3 days.  Encouraged her to follow-up with her doctor concerning her blood pressure.  Explained to her blood pressure is very elevated today and that she needs to take measures to control her blood pressure.  She states she understands and will comply.  She is given a work note as she is contagious.  She was discharged in stable condition.     As part of my medical decision making, I reviewed the following data within the [place] notes reviewed and incorporated, Labs reviewed influenza test negative, Old chart reviewed, Radiograph reviewed x-ray negative, Notes from prior ED visits and [place] Controlled Substance Database  ____________________________________________   FINAL  CLINICAL IMPRESSION(S) / ED DIAGNOSES  Final diagnoses:  Acute bronchitis, unspecified organism  Acute bacterial conjunctivitis of left eye  Essential hypertension      NEW MEDICATIONS STARTED DURING THIS VISIT:  New Prescriptions   AZITHROMYCIN (ZITHROMAX Z-PAK) 250 MG TABLET    2 pills today then 1 pill a day for 4 days   BENZONATATE (TESSALON) 200 MG CAPSULE    Take 1 capsule (200 mg total) by mouth 3 (three) times daily as needed for cough.   TOBRAMYCIN (TOBREX) 0.3 % OPHTHALMIC SOLUTION    Place 2 drops into both eyes every 4 (four) hours.     Note:  This document was prepared using Dragon voice recognition software and may include unintentional dictation errors.    Versie Starks, PA-C 05/18/18 1639    Lavonia Drafts, MD 05/19/18 1215

## 2018-06-22 ENCOUNTER — Encounter: Payer: Self-pay | Admitting: Emergency Medicine

## 2018-06-22 ENCOUNTER — Other Ambulatory Visit: Payer: Self-pay

## 2018-06-22 ENCOUNTER — Emergency Department: Payer: Self-pay

## 2018-06-22 ENCOUNTER — Emergency Department
Admission: EM | Admit: 2018-06-22 | Discharge: 2018-06-22 | Disposition: A | Discharge status: Home / Self Care | Payer: Self-pay | Attending: Emergency Medicine | Admitting: Emergency Medicine

## 2018-06-22 DIAGNOSIS — I10 Essential (primary) hypertension: Secondary | ICD-10-CM | POA: Insufficient documentation

## 2018-06-22 DIAGNOSIS — R1084 Generalized abdominal pain: Secondary | ICD-10-CM | POA: Insufficient documentation

## 2018-06-22 DIAGNOSIS — Z79899 Other long term (current) drug therapy: Secondary | ICD-10-CM | POA: Insufficient documentation

## 2018-06-22 LAB — COMPREHENSIVE METABOLIC PANEL
ALBUMIN: 3.6 g/dL (ref 3.5–5.0)
ALK PHOS: 61 U/L (ref 38–126)
ALT: 14 U/L (ref 0–44)
AST: 15 U/L (ref 15–41)
Anion gap: 8 (ref 5–15)
BUN: 14 mg/dL (ref 6–20)
CALCIUM: 9.1 mg/dL (ref 8.9–10.3)
CHLORIDE: 104 mmol/L (ref 98–111)
CO2: 25 mmol/L (ref 22–32)
CREATININE: 0.77 mg/dL (ref 0.44–1.00)
GFR calc Af Amer: >60 mL/min (ref >60)
GFR calc non Af Amer: >60 mL/min (ref >60)
GLUCOSE: 98 mg/dL (ref 70–99)
Potassium: 3.6 mmol/L (ref 3.5–5.1)
SODIUM: 137 mmol/L (ref 135–145)
Total Bilirubin: 0.4 mg/dL (ref 0.3–1.2)
Total Protein: 7.7 g/dL (ref 6.5–8.1)

## 2018-06-22 LAB — URINALYSIS, COMPLETE (UACMP) WITH MICROSCOPIC
Bacteria, UA: NONE SEEN
Bilirubin Urine: NEGATIVE
GLUCOSE, UA: NEGATIVE mg/dL
KETONES UR: NEGATIVE mg/dL
LEUKOCYTE UA: NEGATIVE
Nitrite: NEGATIVE
PH: 6 (ref 5.0–8.0)
PROTEIN: NEGATIVE mg/dL
Specific Gravity, Urine: 1.012 (ref 1.005–1.030)

## 2018-06-22 LAB — CBC
HEMATOCRIT: 38.8 % (ref 36.0–46.0)
Hemoglobin: 13 g/dL (ref 12.0–15.0)
MCH: 26.6 pg (ref 26.0–34.0)
MCHC: 33.5 g/dL (ref 30.0–36.0)
MCV: 79.3 fL — AB (ref 80.0–100.0)
PLATELETS: 321 10*3/uL (ref 150–400)
RBC: 4.89 MIL/uL (ref 3.87–5.11)
RDW: 15.8 % — AB (ref 11.5–15.5)
WBC: 6.2 10*3/uL (ref 4.0–10.5)
nRBC: 0 % (ref 0.0–0.2)

## 2018-06-22 LAB — LIPASE, BLOOD: LIPASE: 28 U/L (ref 11–51)

## 2018-06-22 LAB — POCT PREGNANCY, URINE: Preg Test, Ur: NEGATIVE

## 2018-06-22 MED ORDER — FENTANYL CITRATE (PF) 100 MCG/2ML IJ SOLN
50.0000 ug | Freq: Once | INTRAMUSCULAR | Status: AC
  Administered 2018-06-22: 50 ug via INTRAVENOUS
  Filled 2018-06-22: qty 2

## 2018-06-22 MED ORDER — SODIUM CHLORIDE 0.9 % IV SOLN
1000.0000 mL | Freq: Once | INTRAVENOUS | Status: AC
  Administered 2018-06-22: 1000 mL via INTRAVENOUS

## 2018-06-22 MED ORDER — IOHEXOL 300 MG/ML  SOLN
125.0000 mL | Freq: Once | INTRAMUSCULAR | Status: AC | PRN
  Administered 2018-06-22: 125 mL via INTRAVENOUS

## 2018-06-22 MED ORDER — ONDANSETRON HCL 4 MG/2ML IJ SOLN
4.0000 mg | Freq: Once | INTRAMUSCULAR | Status: AC
  Administered 2018-06-22: 4 mg via INTRAVENOUS
  Filled 2018-06-22: qty 2

## 2018-06-22 MED ORDER — NAPROXEN 500 MG PO TABS: 500.0000 mg | ORAL_TABLET | Freq: Two times a day (BID) | ORAL | 2 refills | Status: DC

## 2018-06-22 MED ORDER — SODIUM CHLORIDE 0.9% FLUSH: 3.0000 mL | Freq: Once | INTRAVENOUS | Status: DC

## 2018-06-22 MED ORDER — MORPHINE SULFATE (PF) 4 MG/ML IV SOLN
4.0000 mg | Freq: Once | INTRAVENOUS | Status: AC
  Administered 2018-06-22: 4 mg via INTRAVENOUS
  Filled 2018-06-22: qty 1

## 2018-06-22 NOTE — ED Notes (Signed)
Per pt "last night I had benadryl and it made me itchy all over"

## 2018-06-22 NOTE — ED Provider Notes (Signed)
[facility] Emergency Department Provider Note   ____________________________________________    I have reviewed the triage vital signs and the nursing notes.   HISTORY  Chief Complaint Abdominal Pain     HPI Mary Sherman is a 33 y.o. female who presents with complaints of abdominal pain.  Patient describes periumbilical abdominal pain which started last night and worsened this morning, she describes the pain is sharp in nature and feels pressure in her bottom.  She denies pregnancy.  No vaginal bleeding or discharge.  Normal stools, positive nausea 1 episode of vomiting.  History of cholecystectomy.  Has not taken anything for this.  No reports of fevers or chills.  No reports of dysuria.  No history of kidney stones.  Past Medical History:  Diagnosis Date  . DVT (deep venous thrombosis) ([place])   . Herpes genitalia    no current outbreaks  . HSV (herpes simplex virus) infection   . Hypertension 2014  . Obesity affecting pregnancy     Patient Active Problem List   Diagnosis Date Noted  . Antepartum hypertension 08/19/2016  . History of maternal deep vein thrombosis (DVT) 08/19/2016  . Obesity affecting pregnancy 08/19/2016  . Fibroid 08/19/2016  . First trimester screening   . Sickle cell trait [facility])     Past Surgical History:  Procedure Laterality Date  . CHOLECYSTECTOMY  2007    Prior to Admission medications   Medication Sig Start Date End Date Taking? Authorizing Provider  azithromycin (ZITHROMAX Z-PAK) 250 MG tablet 2 pills today then 1 pill a day for 4 days 05/18/18   Caryn Section, Linden Dolin, PA-C  benzonatate (TESSALON) 200 MG capsule Take 1 capsule (200 mg total) by mouth 3 (three) times daily as needed for cough. 05/18/18   Fisher, Linden Dolin, PA-C  chlorothiazide (DIURIL) 250 MG tablet Take 250 mg by mouth every morning. Reported on 09/04/2015    [provider]  naproxen (NAPROSYN) 500 MG tablet Take 1 tablet (500 mg total) by  mouth 2 (two) times daily with a meal. 06/22/18   Lavonia Drafts, MD  ranitidine (ZANTAC) 150 MG tablet Take 1 tablet (150 mg total) by mouth 2 (two) times daily. 12/31/16 12/31/17  Benjaman Kindler, MD  rivaroxaban (XARELTO) 20 MG TABS tablet Take 20 mg by mouth daily with supper.    [provider]  tobramycin (TOBREX) 0.3 % ophthalmic solution Place 2 drops into both eyes every 4 (four) hours. 05/18/18   Versie Starks, PA-C     Allergies Patient has no known allergies.  Family History  Problem Relation Age of Onset  . Hypertension Mother   . Hypertension Sister     Social History Social History   Tobacco Use  . Smoking status: Never Smoker  . Smokeless tobacco: Never Used  Substance Use Topics  . Alcohol use: No  . Drug use: Yes    Types: Marijuana    Comment: not during pregnancy    Review of Systems  Constitutional: No fever/chills Eyes: No visual changes.  ENT: No sore throat. Cardiovascular: Denies chest pain. Respiratory: Denies shortness of breath. Gastrointestinal: As above Genitourinary: As above Musculoskeletal: Negative for back pain. Skin: Negative for rash. Neurological: Negative for headaches    ____________________________________________   PHYSICAL EXAM:  VITAL SIGNS: ED Triage Vitals  Enc Vitals Group     BP 06/22/18 0753 (!) 148/114     Pulse Rate 06/22/18 0750 94     Resp 06/22/18 0750 (!) 22  Temp 06/22/18 0750 98 F (36.7 C)     Temp Source 06/22/18 0750 Oral     SpO2 06/22/18 0750 100 %     Weight 06/22/18 0751 (!) 163.3 kg (360 lb)     Height 06/22/18 0751 1.626 m (5\' 4" )     Head Circumference --      Peak Flow --      Pain Score 06/22/18 0750 10     Pain Loc --      Pain Edu? --      Excl. in [place]? --     Constitutional: Alert and oriented.  Crying out in pain Eyes: Conjunctivae are normal.  Head: Atraumatic. Nose: No congestion/rhinnorhea. Mouth/Throat: Mucous membranes are moist.    Cardiovascular: Normal  rate, regular rhythm. Grossly normal heart sounds.  Good peripheral circulation. Respiratory: Normal respiratory effort.  No retractions. Lungs CTAB. Gastrointestinal: Soft, nontender, no significant distention, no evidence of gravidity  Musculoskeletal: No lower extremity tenderness nor edema.  Warm and well perfused Neurologic:  Normal speech and language. No gross focal neurologic deficits are appreciated.  Skin:  Skin is warm, mildly diaphoretic Psychiatric: Mood and affect are normal. Speech and behavior are normal.  ____________________________________________   LABS (all labs ordered are listed, but only abnormal results are displayed)  Labs Reviewed  CBC - Abnormal; Notable for the following components:      Result Value   MCV 79.3 (*)    RDW 15.8 (*)    All other components within normal limits  URINALYSIS, COMPLETE (UACMP) WITH MICROSCOPIC - Abnormal; Notable for the following components:   Color, Urine STRAW (*)    APPearance CLEAR (*)    Hgb urine dipstick SMALL (*)    All other components within normal limits  LIPASE, BLOOD  COMPREHENSIVE METABOLIC PANEL  APTT  PROTIME-INR  POC URINE PREG, ED  POCT PREGNANCY, URINE   ____________________________________________  EKG  None ____________________________________________  RADIOLOGY  CT without acute abnormality ____________________________________________   PROCEDURES  Procedure(s) performed: No  Procedures   Critical Care performed: No ____________________________________________   INITIAL IMPRESSION / ASSESSMENT AND PLAN / ED COURSE  Pertinent labs & imaging results that were available during my care of the patient were reviewed by me and considered in my medical decision making (see chart for details).  Patient presented with abdominal pain and reports of pressure in her perineum but denies a history of pregnancy.  EM bedside ultrasound demonstrated no evidence of pregnancy/fetus.  Treated  with IV fentanyl in triage and then IV morphine in the emergency department, IV fluids infusing, pending labs, will obtain CT abdomen pelvis  ----------------------------------------- 9:59 AM on 06/22/2018 -----------------------------------------  CT unremarkable, on reexam patient reports near complete resolution of pain, suspicious for possible ovarian cyst rupture lab work is benign.  Given the patient is feeling much better we will discharge on anti-inflammatories, return precautions discussed    ____________________________________________   FINAL CLINICAL IMPRESSION(S) / ED DIAGNOSES  Final diagnoses:  Generalized abdominal pain        Note:  This document was prepared using Dragon voice recognition software and may include unintentional dictation errors.   Lavonia Drafts, MD 06/22/18 [phone]

## 2018-06-22 NOTE — ED Notes (Signed)
Patient transported to CT 

## 2018-06-22 NOTE — ED Triage Notes (Signed)
Pt arrived to triage moaning and tearful, states she is in severe lower abd pain that began yesterday but is worsening, denies hx of kidney stones, pain is on LLQ radiating across lower abd, does have appendix.

## 2019-06-05 ENCOUNTER — Other Ambulatory Visit: Payer: Self-pay

## 2019-06-05 ENCOUNTER — Emergency Department: Payer: Medicaid Other

## 2019-06-05 ENCOUNTER — Encounter: Payer: Self-pay | Admitting: Intensive Care

## 2019-06-05 ENCOUNTER — Emergency Department
Admission: EM | Admit: 2019-06-05 | Discharge: 2019-06-05 | Disposition: A | Discharge status: Home / Self Care | Payer: Medicaid Other | Attending: Emergency Medicine | Admitting: Emergency Medicine

## 2019-06-05 DIAGNOSIS — R102 Pelvic and perineal pain: Secondary | ICD-10-CM | POA: Diagnosis not present

## 2019-06-05 DIAGNOSIS — Z7901 Long term (current) use of anticoagulants: Secondary | ICD-10-CM | POA: Insufficient documentation

## 2019-06-05 DIAGNOSIS — I10 Essential (primary) hypertension: Secondary | ICD-10-CM | POA: Diagnosis not present

## 2019-06-05 DIAGNOSIS — R1030 Lower abdominal pain, unspecified: Secondary | ICD-10-CM | POA: Diagnosis present

## 2019-06-05 DIAGNOSIS — N10 Acute tubulo-interstitial nephritis: Secondary | ICD-10-CM

## 2019-06-05 DIAGNOSIS — Z86718 Personal history of other venous thrombosis and embolism: Secondary | ICD-10-CM | POA: Insufficient documentation

## 2019-06-05 DIAGNOSIS — Z79899 Other long term (current) drug therapy: Secondary | ICD-10-CM | POA: Insufficient documentation

## 2019-06-05 LAB — LIPASE, BLOOD: Lipase: 21 U/L (ref 11–51)

## 2019-06-05 LAB — COMPREHENSIVE METABOLIC PANEL
ALT: 12 U/L (ref 0–44)
AST: 15 U/L (ref 15–41)
Albumin: 3.2 g/dL — ABNORMAL LOW (ref 3.5–5.0)
Alkaline Phosphatase: 67 U/L (ref 38–126)
Anion gap: 13 (ref 5–15)
BUN: 9 mg/dL (ref 6–20)
CO2: 21 mmol/L — ABNORMAL LOW (ref 22–32)
Calcium: 8.4 mg/dL — ABNORMAL LOW (ref 8.9–10.3)
Chloride: 100 mmol/L (ref 98–111)
Creatinine, Ser: 1.28 mg/dL — ABNORMAL HIGH (ref 0.44–1.00)
GFR calc Af Amer: >60 mL/min (ref >60)
GFR calc non Af Amer: 55 mL/min — ABNORMAL LOW (ref >60)
Glucose, Bld: 96 mg/dL (ref 70–99)
Potassium: 3 mmol/L — ABNORMAL LOW (ref 3.5–5.1)
Sodium: 134 mmol/L — ABNORMAL LOW (ref 135–145)
Total Bilirubin: 0.6 mg/dL (ref 0.3–1.2)
Total Protein: 7.2 g/dL (ref 6.5–8.1)

## 2019-06-05 LAB — URINALYSIS, COMPLETE (UACMP) WITH MICROSCOPIC
Bilirubin Urine: NEGATIVE
Glucose, UA: NEGATIVE mg/dL
Ketones, ur: 20 mg/dL — AB
Nitrite: POSITIVE — AB
Protein, ur: 100 mg/dL — AB
Specific Gravity, Urine: 1.011 (ref 1.005–1.030)
WBC, UA: >50 WBC/hpf — ABNORMAL HIGH (ref 0–5)
pH: 6 (ref 5.0–8.0)

## 2019-06-05 LAB — CBC
HCT: 35.2 % — ABNORMAL LOW (ref 36.0–46.0)
Hemoglobin: 11.7 g/dL — ABNORMAL LOW (ref 12.0–15.0)
MCH: 25.9 pg — ABNORMAL LOW (ref 26.0–34.0)
MCHC: 33.2 g/dL (ref 30.0–36.0)
MCV: 78 fL — ABNORMAL LOW (ref 80.0–100.0)
Platelets: 255 10*3/uL (ref 150–400)
RBC: 4.51 MIL/uL (ref 3.87–5.11)
RDW: 16.2 % — ABNORMAL HIGH (ref 11.5–15.5)
WBC: 12.7 10*3/uL — ABNORMAL HIGH (ref 4.0–10.5)
nRBC: 0 % (ref 0.0–0.2)

## 2019-06-05 LAB — HCG, QUANTITATIVE, PREGNANCY: hCG, Beta Chain, Quant, S: <1 m[IU]/mL (ref <5)

## 2019-06-05 MED ORDER — METOCLOPRAMIDE HCL 5 MG/ML IJ SOLN
10.0000 mg | Freq: Once | INTRAMUSCULAR | Status: AC
  Administered 2019-06-05: 10 mg via INTRAVENOUS
  Filled 2019-06-05: qty 2

## 2019-06-05 MED ORDER — ACETAMINOPHEN 500 MG PO TABS
1000.0000 mg | ORAL_TABLET | Freq: Once | ORAL | Status: AC
  Administered 2019-06-05: 14:00:00 1000 mg via ORAL
  Filled 2019-06-05: qty 2

## 2019-06-05 MED ORDER — MORPHINE SULFATE (PF) 4 MG/ML IV SOLN
4.0000 mg | Freq: Once | INTRAVENOUS | Status: AC
  Administered 2019-06-05: 16:00:00 4 mg via INTRAVENOUS
  Filled 2019-06-05: qty 1

## 2019-06-05 MED ORDER — ONDANSETRON HCL 4 MG/2ML IJ SOLN
4.0000 mg | Freq: Once | INTRAMUSCULAR | Status: AC
  Administered 2019-06-05: 16:00:00 4 mg via INTRAVENOUS
  Filled 2019-06-05: qty 2

## 2019-06-05 MED ORDER — SODIUM CHLORIDE 0.9 % IV BOLUS
1000.0000 mL | Freq: Once | INTRAVENOUS | Status: AC
  Administered 2019-06-05: 16:00:00 1000 mL via INTRAVENOUS

## 2019-06-05 MED ORDER — METOCLOPRAMIDE HCL 10 MG PO TABS: 10.0000 mg | ORAL_TABLET | Freq: Three times a day (TID) | ORAL | 0 refills | Status: DC | PRN

## 2019-06-05 MED ORDER — ONDANSETRON HCL 4 MG/2ML IJ SOLN
4.0000 mg | Freq: Once | INTRAMUSCULAR | Status: AC
  Administered 2019-06-05: 4 mg via INTRAVENOUS

## 2019-06-05 MED ORDER — SODIUM CHLORIDE 0.9 % IV SOLN
1.0000 g | Freq: Once | INTRAVENOUS | Status: AC
  Administered 2019-06-05: 20:00:00 1 g via INTRAVENOUS
  Filled 2019-06-05: qty 10

## 2019-06-05 MED ORDER — CEFDINIR 300 MG PO CAPS: 300.0000 mg | ORAL_CAPSULE | Freq: Two times a day (BID) | ORAL | 0 refills | Status: AC

## 2019-06-05 MED ORDER — ONDANSETRON 8 MG PO TBDP: 8.0000 mg | ORAL_TABLET | Freq: Three times a day (TID) | ORAL | 0 refills | Status: DC | PRN

## 2019-06-05 MED ORDER — IOHEXOL 300 MG/ML  SOLN
125.0000 mL | Freq: Once | INTRAMUSCULAR | Status: AC | PRN
  Administered 2019-06-05: 18:00:00 125 mL via INTRAVENOUS

## 2019-06-05 MED ORDER — TRAMADOL HCL 50 MG PO TABS: 50.0000 mg | ORAL_TABLET | Freq: Four times a day (QID) | ORAL | 0 refills | Status: AC | PRN

## 2019-06-05 NOTE — ED Triage Notes (Signed)
Patient reports since Sunday she has been having pelvic pain, lower back pain, and nausea with vomiting. Has not been able to eat. Has been trying to get pregnant and took a home test Sunday that came back with a question mark. Reports cycle was suppose to come on two weeks ago. Denies any discharge or vaginal bleeding

## 2019-06-05 NOTE — ED Provider Notes (Signed)
[facility] Emergency Department Provider Note ____________________________________________   First MD Initiated Contact with Patient 06/05/19 1513     (approximate)  I have reviewed the triage vital signs and the nursing notes.   HISTORY  Chief Complaint Abdominal Pain and Nausea    HPI ELLEAN BANDO is a 34 y.o. female with PMH as noted below who presents with lower abdominal pain over the last 3 days, described as crampy, and occurring over the bilateral lower abdomen.  It radiates to her lower back.  She reports nausea with multiple episodes of vomiting, but has had no diarrhea, fever, dysuria, vaginal bleeding, or any discharge.  Patient states she had similar pain once before with an ovarian cyst.  She also states she took a home pregnancy test which resulted with a question mark.  Past Medical History:  Diagnosis Date  . DVT (deep venous thrombosis) ([place])   . Herpes genitalia    no current outbreaks  . HSV (herpes simplex virus) infection   . Hypertension 2014  . Obesity affecting pregnancy     Patient Active Problem List   Diagnosis Date Noted  . Antepartum hypertension 08/19/2016  . History of maternal deep vein thrombosis (DVT) 08/19/2016  . Obesity affecting pregnancy 08/19/2016  . Fibroid 08/19/2016  . First trimester screening   . Sickle cell trait [facility])     Past Surgical History:  Procedure Laterality Date  . CHOLECYSTECTOMY  2007    Prior to Admission medications   Medication Sig Start Date End Date Taking? Authorizing Provider  azithromycin (ZITHROMAX Z-PAK) 250 MG tablet 2 pills today then 1 pill a day for 4 days 05/18/18   Caryn Section, Linden Dolin, PA-C  benzonatate (TESSALON) 200 MG capsule Take 1 capsule (200 mg total) by mouth 3 (three) times daily as needed for cough. 05/18/18   Fisher, Linden Dolin, PA-C  cefdinir (OMNICEF) 300 MG capsule Take 1 capsule (300 mg total) by mouth 2 (two) times daily for 10 days. 06/05/19 06/15/19   Arta Silence, MD  chlorothiazide (DIURIL) 250 MG tablet Take 250 mg by mouth every morning. Reported on 09/04/2015    [provider]  naproxen (NAPROSYN) 500 MG tablet Take 1 tablet (500 mg total) by mouth 2 (two) times daily with a meal. 06/22/18   Lavonia Drafts, MD  ondansetron (ZOFRAN ODT) 8 MG disintegrating tablet Take 1 tablet (8 mg total) by mouth every 8 (eight) hours as needed for nausea or vomiting. 06/05/19   Arta Silence, MD  ranitidine (ZANTAC) 150 MG tablet Take 1 tablet (150 mg total) by mouth 2 (two) times daily. 12/31/16 12/31/17  Benjaman Kindler, MD  rivaroxaban (XARELTO) 20 MG TABS tablet Take 20 mg by mouth daily with supper.    [provider]  tobramycin (TOBREX) 0.3 % ophthalmic solution Place 2 drops into both eyes every 4 (four) hours. 05/18/18   Fisher, Linden Dolin, PA-C  traMADol (ULTRAM) 50 MG tablet Take 1 tablet (50 mg total) by mouth every 6 (six) hours as needed for up to 5 days for moderate pain or severe pain. 06/05/19 06/10/19  Arta Silence, MD    Allergies Patient has no known allergies.  Family History  Problem Relation Age of Onset  . Hypertension Mother   . Hypertension Sister     Social History Social History   Tobacco Use  . Smoking status: Never Smoker  . Smokeless tobacco: Never Used  Substance Use Topics  . Alcohol use: Yes  Comment: occ  . Drug use: Yes    Types: Marijuana    Comment: not during pregnancy    Review of Systems  Constitutional: No fever. Eyes: No redness. ENT: No sore throat. Cardiovascular: Denies chest pain. Respiratory: Denies shortness of breath. Gastrointestinal: Positive for vomiting. Genitourinary: Negative for dysuria or vaginal bleeding.  Musculoskeletal: Positive for back pain. Skin: Negative for rash. Neurological: Negative for headache.   ____________________________________________   PHYSICAL EXAM:  VITAL SIGNS: ED Triage Vitals  Enc Vitals Group     BP 06/05/19  1402 (!) 145/94     Pulse Rate 06/05/19 1402 88     Resp 06/05/19 1402 20     Temp 06/05/19 1402 99.2 F (37.3 C)     Temp Source 06/05/19 1402 Oral     SpO2 06/05/19 1402 100 %     Weight 06/05/19 1358 (!) 340 lb (154.2 kg)     Height 06/05/19 1358 5\' 4"  (1.626 m)     Head Circumference --      Peak Flow --      Pain Score 06/05/19 1358 10     Pain Loc --      Pain Edu? --      Excl. in [place]? --     Constitutional: Alert and oriented.  Uncomfortable and anxious appearing but in no acute distress. Eyes: Conjunctivae are normal.  No scleral icterus. Head: Atraumatic. Nose: No congestion/rhinnorhea. Mouth/Throat: Mucous membranes are moist.   Neck: Normal range of motion.  Cardiovascular: Normal rate, regular rhythm. Good peripheral circulation. Respiratory: Normal respiratory effort.  No retractions. Gastrointestinal: Soft with mild bilateral lower quadrant tenderness, somewhat worse on the left.  No distention.  Genitourinary: No flank tenderness. Musculoskeletal:  Extremities warm and well perfused.  Neurologic:  Normal speech and language. No gross focal neurologic deficits are appreciated.  Skin:  Skin is warm and dry. No rash noted. Psychiatric: Mood and affect are normal. Speech and behavior are normal.  ____________________________________________   LABS (all labs ordered are listed, but only abnormal results are displayed)  Labs Reviewed  COMPREHENSIVE METABOLIC PANEL - Abnormal; Notable for the following components:      Result Value   Sodium 134 (*)    Potassium 3.0 (*)    CO2 21 (*)    Creatinine, Ser 1.28 (*)    Calcium 8.4 (*)    Albumin 3.2 (*)    GFR calc non Af Amer 55 (*)    All other components within normal limits  CBC - Abnormal; Notable for the following components:   WBC 12.7 (*)    Hemoglobin 11.7 (*)    HCT 35.2 (*)    MCV 78.0 (*)    MCH 25.9 (*)    RDW 16.2 (*)    All other components within normal limits  URINALYSIS, COMPLETE (UACMP)  WITH MICROSCOPIC - Abnormal; Notable for the following components:   Color, Urine YELLOW (*)    APPearance CLOUDY (*)    Hgb urine dipstick SMALL (*)    Ketones, ur 20 (*)    Protein, ur 100 (*)    Nitrite POSITIVE (*)    Leukocytes,Ua LARGE (*)    WBC, UA >50 (*)    Bacteria, UA MANY (*)    All other components within normal limits  LIPASE, BLOOD  HCG, QUANTITATIVE, PREGNANCY  POC URINE PREG, ED   ____________________________________________  EKG   ____________________________________________  RADIOLOGY  US pelvis: 4.6 cm leiomyoma with no other acute abnormalities CT  abdomen: Mild right perinephric stranding with no other acute abnormality  ____________________________________________   PROCEDURES  Procedure(s) performed: No  Procedures  Critical Care performed: No ____________________________________________   INITIAL IMPRESSION / ASSESSMENT AND PLAN / ED COURSE  Pertinent labs & imaging results that were available during my care of the patient were reviewed by me and considered in my medical decision making (see chart for details).  34 year old female with PMH as noted above presents with bilateral lower quadrant crampy abdominal pain over the last few days associated with multiple episodes of vomiting, and some loose stools but no significant diarrhea.  She denies any urinary symptoms or any vaginal bleeding or discharge.  She does report a prior history of an ovarian cyst which she states caused somewhat similar pain.  She had an unclear result on her pregnancy test at home a few days ago.  On exam, the patient is anxious and uncomfortable appearing.  Her vital signs are normal except for mildly elevated blood pressure and borderline temperature.  The abdomen is soft with some bilateral lower quadrant tenderness, perhaps somewhat worse on the left.  Differential includes ovarian cyst rupture or other gynecologic cause.  Given the duration of the symptoms and the  location of the pain more in the abdomen then the pelvis, I have a low suspicion for ovarian torsion.  Differential also includes UTI/cystitis, colitis, diverticulitis, or less likely appendicitis.  Serum hCG is negative.  The lab work-up is otherwise unremarkable except for slightly elevated WBC count and low potassium.  We will give fluids, analgesia, and obtain a pelvic ultrasound.  If this is inconclusive I will proceed to CT abdomen.  ----------------------------------------- 7:31 PM on 06/05/2019 -----------------------------------------  Ultrasound showed no significant acute abnormalities other than a fibroid, which is unlikely to be the source of the patient's pain.  The urinalysis shows significant findings and consistent with UTI which does go along with the location of the pain.  However, due to the severity of the pain especially when she initially presented, I proceeded with a CT to further evaluate for other intra-abdominal causes.  The CT shows some right-sided perinephric standing consistent with possible pyelonephritis, but no other acute intra-abdominal abnormalities.  On reassessment, the patient states she feels much better, and appears comfortable.  Her vital signs have remained stable.  She has no clinical findings of sepsis, and she is tolerating p.o.  She feels comfortable going home.  Therefore, I think she is appropriate for outpatient treatment for likely early pyelonephritis.  I will give a dose of ceftriaxone in the ED and then discharged with cefdinir.  I discussed the results of the work-up with the patient and as well as the plan of care and thorough return precautions, and she expressed understanding.  ____________________________________________   FINAL CLINICAL IMPRESSION(S) / ED DIAGNOSES  Final diagnoses:  Lower abdominal pain  Pelvic pain  Acute pyelonephritis      NEW MEDICATIONS STARTED DURING THIS VISIT:  New Prescriptions   CEFDINIR (OMNICEF)  300 MG CAPSULE    Take 1 capsule (300 mg total) by mouth 2 (two) times daily for 10 days.   ONDANSETRON (ZOFRAN ODT) 8 MG DISINTEGRATING TABLET    Take 1 tablet (8 mg total) by mouth every 8 (eight) hours as needed for nausea or vomiting.   TRAMADOL (ULTRAM) 50 MG TABLET    Take 1 tablet (50 mg total) by mouth every 6 (six) hours as needed for up to 5 days for moderate pain or severe  pain.     Note:  This document was prepared using Dragon voice recognition software and may include unintentional dictation errors.   Arta Silence, MD 06/05/19 [phone]

## 2019-06-05 NOTE — ED Notes (Signed)
Pt taken to ultrasound

## 2019-06-05 NOTE — Discharge Instructions (Addendum)
Take the antibiotic [facility]) as prescribed for the full 10-day course.  You may take the tramadol as needed for pain (although you may also take over-the-counter ibuprofen), and the Zofran as needed for nausea.  Return to the ER for new, worsening, or persistent severe pain, vomiting, fever, weakness, or any other new or worsening symptoms that concern you.

## 2019-06-05 NOTE — ED Notes (Signed)
[facility] [place] notified of pt's persistent nausea and vomiting.

## 2020-06-20 ENCOUNTER — Other Ambulatory Visit: Payer: Self-pay

## 2020-06-20 ENCOUNTER — Emergency Department
Admission: EM | Admit: 2020-06-20 | Discharge: 2020-06-20 | Disposition: A | Discharge status: Home / Self Care | Payer: Medicaid Other | Attending: Student in an Organized Health Care Education/Training Program | Admitting: Student in an Organized Health Care Education/Training Program

## 2020-06-20 ENCOUNTER — Emergency Department: Payer: Medicaid Other

## 2020-06-20 DIAGNOSIS — M75102 Unspecified rotator cuff tear or rupture of left shoulder, not specified as traumatic: Secondary | ICD-10-CM | POA: Diagnosis not present

## 2020-06-20 DIAGNOSIS — M5412 Radiculopathy, cervical region: Secondary | ICD-10-CM | POA: Insufficient documentation

## 2020-06-20 DIAGNOSIS — Z7901 Long term (current) use of anticoagulants: Secondary | ICD-10-CM | POA: Insufficient documentation

## 2020-06-20 DIAGNOSIS — I10 Essential (primary) hypertension: Secondary | ICD-10-CM | POA: Diagnosis not present

## 2020-06-20 DIAGNOSIS — Z79899 Other long term (current) drug therapy: Secondary | ICD-10-CM | POA: Diagnosis not present

## 2020-06-20 DIAGNOSIS — R202 Paresthesia of skin: Secondary | ICD-10-CM | POA: Diagnosis not present

## 2020-06-20 DIAGNOSIS — M25512 Pain in left shoulder: Secondary | ICD-10-CM | POA: Diagnosis present

## 2020-06-20 LAB — CBC
HCT: 36.6 % (ref 36.0–46.0)
Hemoglobin: 12.4 g/dL (ref 12.0–15.0)
MCH: 27.7 pg (ref 26.0–34.0)
MCHC: 33.9 g/dL (ref 30.0–36.0)
MCV: 81.9 fL (ref 80.0–100.0)
Platelets: 273 10*3/uL (ref 150–400)
RBC: 4.47 MIL/uL (ref 3.87–5.11)
RDW: 15.1 % (ref 11.5–15.5)
WBC: 7.9 10*3/uL (ref 4.0–10.5)
nRBC: 0 % (ref 0.0–0.2)

## 2020-06-20 LAB — BASIC METABOLIC PANEL
Anion gap: 7 (ref 5–15)
BUN: 15 mg/dL (ref 6–20)
CO2: 26 mmol/L (ref 22–32)
Calcium: 8.6 mg/dL — ABNORMAL LOW (ref 8.9–10.3)
Chloride: 103 mmol/L (ref 98–111)
Creatinine, Ser: 0.94 mg/dL (ref 0.44–1.00)
GFR, Estimated: >60 mL/min (ref >60)
Glucose, Bld: 107 mg/dL — ABNORMAL HIGH (ref 70–99)
Potassium: 3.8 mmol/L (ref 3.5–5.1)
Sodium: 136 mmol/L (ref 135–145)

## 2020-06-20 LAB — TROPONIN I (HIGH SENSITIVITY): Troponin I (High Sensitivity): 3 ng/L (ref <18)

## 2020-06-20 LAB — PROTIME-INR
INR: 1.2 (ref 0.8–1.2)
Prothrombin Time: 14.4 seconds (ref 11.4–15.2)

## 2020-06-20 MED ORDER — PREDNISONE 20 MG PO TABS
60.0000 mg | ORAL_TABLET | Freq: Once | ORAL | Status: AC
  Administered 2020-06-20: 60 mg via ORAL
  Filled 2020-06-20: qty 3

## 2020-06-20 MED ORDER — KETOROLAC TROMETHAMINE 30 MG/ML IJ SOLN
30.0000 mg | Freq: Once | INTRAMUSCULAR | Status: AC
  Administered 2020-06-20: 30 mg via INTRAMUSCULAR
  Filled 2020-06-20: qty 1

## 2020-06-20 MED ORDER — PREDNISONE 10 MG PO TABS: 10.0000 mg | ORAL_TABLET | Freq: Every day | ORAL | 0 refills | Status: DC

## 2020-06-20 NOTE — ED Triage Notes (Signed)
Pt states she is having left sided arm pain that started 3 days ago. Pt states that it feels like a blood clot, as pt states she had one in past on her leg.  Pt states history of high blood pressure, but did not take her medications today

## 2020-06-20 NOTE — Discharge Instructions (Addendum)
Please take prednisone as prescribed x6 days.  You may use Tylenol for additional pain relief.  If no improvement in 1 week please follow-up with orthopedics.

## 2020-06-20 NOTE — ED Notes (Signed)
Provider made aware of pt and EKG given to provider. ER provider placed in [place] order and confirmed no chest x-ray is needed at this time.

## 2020-06-20 NOTE — ED Provider Notes (Signed)
[place] EMERGENCY DEPARTMENT Provider Note   CSN: [id number] Arrival date & time: 06/20/20  1910     History Chief Complaint  Patient presents with  . Arm Pain    Mary Sherman is a 35 y.o. female presents to the emerge department for evaluation of left shoulder pain.  Pain is been present for 3 days.  No trauma or injury.  She is concerned she may have a blood clot, had a blood clot with pregnancy several years ago, completed into coagulation therapy.  She describes numbness and tingling going down the shoulder into the forearm and hand is been worse over the last few days.  She also has pain with shoulder range of motion above 90 degrees of abduction.  No weakness.  No headache or neck pain.  No fevers.  HPI     Past Medical History:  Diagnosis Date  . DVT (deep venous thrombosis) ([place])   . Herpes genitalia    no current outbreaks  . HSV (herpes simplex virus) infection   . Hypertension 2014  . Obesity affecting pregnancy     Patient Active Problem List   Diagnosis Date Noted  . Antepartum hypertension 08/19/2016  . History of maternal deep vein thrombosis (DVT) 08/19/2016  . Obesity affecting pregnancy 08/19/2016  . Fibroid 08/19/2016  . First trimester screening   . Sickle cell trait [facility])     Past Surgical History:  Procedure Laterality Date  . CHOLECYSTECTOMY  2007     OB History    Gravida  6   Para  2   Term  2   Preterm      AB  3   Living  2     SAB  2   IAB      Ectopic      Multiple      Live Births  2           Family History  Problem Relation Age of Onset  . Hypertension Mother   . Hypertension Sister     Social History   Tobacco Use  . Smoking status: Never Smoker  . Smokeless tobacco: Never Used  Substance Use Topics  . Alcohol use: Yes    Comment: occ  . Drug use: Yes    Types: Marijuana    Comment: not during pregnancy    Home Medications Prior to Admission medications    Medication Sig Start Date End Date Taking? Authorizing Provider  predniSONE (DELTASONE) 10 MG tablet Take 1 tablet (10 mg total) by mouth daily. 6,5,4,3,2,1 six day taper 06/20/20  Yes Duanne Guess, PA-C  azithromycin (ZITHROMAX Z-PAK) 250 MG tablet 2 pills today then 1 pill a day for 4 days 05/18/18   Caryn Section Linden Dolin, PA-C  benzonatate (TESSALON) 200 MG capsule Take 1 capsule (200 mg total) by mouth 3 (three) times daily as needed for cough. 05/18/18   Fisher, Linden Dolin, PA-C  chlorothiazide (DIURIL) 250 MG tablet Take 250 mg by mouth every morning. Reported on 09/04/2015    [provider]  metoCLOPramide (REGLAN) 10 MG tablet Take 1 tablet (10 mg total) by mouth every 8 (eight) hours as needed for up to 5 days for nausea or vomiting. 06/05/19 06/10/19  Arta Silence, MD  naproxen (NAPROSYN) 500 MG tablet Take 1 tablet (500 mg total) by mouth 2 (two) times daily with a meal. 06/22/18   Lavonia Drafts, MD  ondansetron (ZOFRAN ODT) 8 MG disintegrating tablet Take 1 tablet (  8 mg total) by mouth every 8 (eight) hours as needed for nausea or vomiting. 06/05/19   Arta Silence, MD  ranitidine (ZANTAC) 150 MG tablet Take 1 tablet (150 mg total) by mouth 2 (two) times daily. 12/31/16 12/31/17  Benjaman Kindler, MD  rivaroxaban (XARELTO) 20 MG TABS tablet Take 20 mg by mouth daily with supper.    [provider]  tobramycin (TOBREX) 0.3 % ophthalmic solution Place 2 drops into both eyes every 4 (four) hours. 05/18/18   Caryn Section Linden Dolin, PA-C    Allergies    Benadryl [diphenhydramine]  Review of Systems   Review of Systems  Constitutional: Negative for fatigue.  Respiratory: Negative for shortness of breath.   Cardiovascular: Negative for chest pain.  Gastrointestinal: Negative for nausea.  Musculoskeletal: Positive for arthralgias and myalgias. Negative for joint swelling, neck pain and neck stiffness.  Neurological: Positive for numbness. Negative for dizziness and headaches.     Physical Exam Updated Vital Signs BP (!) 165/79 (BP Location: Right Arm)   Pulse 75   Temp 98 F (36.7 C)   Resp 17   Ht 5\' 4"  (1.626 m)   Wt (!) 154.2 kg   SpO2 98%   BMI 58.36 kg/m   Physical Exam Constitutional:      Appearance: She is well-developed and well-nourished.  HENT:     Head: Normocephalic and atraumatic.  Eyes:     Conjunctiva/sclera: Conjunctivae normal.  Cardiovascular:     Rate and Rhythm: Normal rate.  Pulmonary:     Effort: Pulmonary effort is normal. No respiratory distress.  Musculoskeletal:     Cervical back: Normal range of motion.     Comments: Normal active range of motion of the left shoulder and cervical spine.  She has positive Hawkins and impingement test as well as empty can test of the left shoulder with pain along the lateral deltoid.  She is tender along the subacromial space with no AC joint tenderness.  She has good internal X rotation with no discomfort.  She has a positive Spurling's test to the left.  She has no weakness with supraspinatus biceps, triceps and grip strength the left hand.  Skin:    General: Skin is warm.     Findings: No rash.  Neurological:     General: No focal deficit present.     Mental Status: She is alert and oriented to person, place, and time.     Motor: No weakness.  Psychiatric:        Mood and Affect: Mood and affect normal.        Behavior: Behavior normal.        Thought Content: Thought content normal.     ED Results / Procedures / Treatments   Labs (all labs ordered are listed, but only abnormal results are displayed) Labs Reviewed  BASIC METABOLIC PANEL - Abnormal; Notable for the following components:      Result Value   Glucose, Bld 107 (*)    Calcium 8.6 (*)    All other components within normal limits  CBC  PROTIME-INR  POC URINE PREG, ED  TROPONIN I (HIGH SENSITIVITY)  TROPONIN I (HIGH SENSITIVITY)    EKG None  Radiology US Venous Img Upper Left (DVT Study)  Result Date:  06/20/2020 CLINICAL DATA:  Left arm pain, history of DVT EXAM: LEFT UPPER EXTREMITY VENOUS DOPPLER ULTRASOUND TECHNIQUE: Gray-scale sonography with graded compression, as well as color Doppler and duplex ultrasound were performed to evaluate the upper extremity  deep venous system from the level of the subclavian vein and including the jugular, axillary, basilic, radial, ulnar and upper cephalic vein. Spectral Doppler was utilized to evaluate flow at rest and with distal augmentation maneuvers. COMPARISON:  None. FINDINGS: Contralateral Subclavian Vein: Respiratory phasicity is normal and symmetric with the symptomatic side. No evidence of thrombus. Normal compressibility. Internal Jugular Vein: No evidence of thrombus. Normal compressibility, respiratory phasicity and response to augmentation. Subclavian Vein: No evidence of thrombus. Normal compressibility, respiratory phasicity and response to augmentation. Axillary Vein: No evidence of thrombus. Normal compressibility, respiratory phasicity and response to augmentation. Cephalic Vein: No evidence of thrombus. Normal compressibility, respiratory phasicity and response to augmentation. Basilic Vein: No evidence of thrombus. Normal compressibility, respiratory phasicity and response to augmentation. Brachial Veins: No evidence of thrombus. Normal compressibility, respiratory phasicity and response to augmentation. Radial Veins: No evidence of thrombus. Normal compressibility, respiratory phasicity and response to augmentation. Ulnar Veins: No evidence of thrombus. Normal compressibility, respiratory phasicity and response to augmentation. Venous Reflux:  None visualized. Other Findings:  None visualized. IMPRESSION: No evidence of DVT within the left upper extremity. Electronically Signed   By: Randa Ngo M.D.   On: 06/20/2020 20:24    Procedures Procedures   Medications Ordered in ED Medications  ketorolac (TORADOL) 30 MG/ML injection 30 mg (30 mg  Intramuscular Given 06/20/20 2115)  predniSONE (DELTASONE) tablet 60 mg (60 mg Oral Given 06/20/20 2117)    ED Course  I have reviewed the triage vital signs and the nursing notes.  Pertinent labs & imaging results that were available during my care of the patient were reviewed by me and considered in my medical decision making (see chart for details).    MDM Rules/Calculators/A&P                          35 year old female with left arm pain.  She is concerned about blood clot.  Ultrasound negative.  History and exam findings consistent with a left shoulder rotator cuff tendinitis as well as a mild left cervical radiculopathy with no myelopathy.  Patient is given Toradol IM x1 with a 6-day steroid taper.  She will follow with orthopedics if no improvement 1 week. Final Clinical Impression(s) / ED Diagnoses Final diagnoses:  Rotator cuff syndrome, left  Left cervical radiculopathy    Rx / DC Orders ED Discharge Orders         Ordered    predniSONE (DELTASONE) 10 MG tablet  Daily        06/20/20 2119           Renata Caprice 06/20/20 2124    Merlyn Lot, MD 06/20/20 2256

## 2020-10-07 IMAGING — CR DG CHEST 2V
2 series · 2 of 2 positions shown · non-contrast
Comparison: No prior.

CLINICAL DATA: Left eye drainage.

EXAM:
CHEST - 2 VIEW

[chest pa]
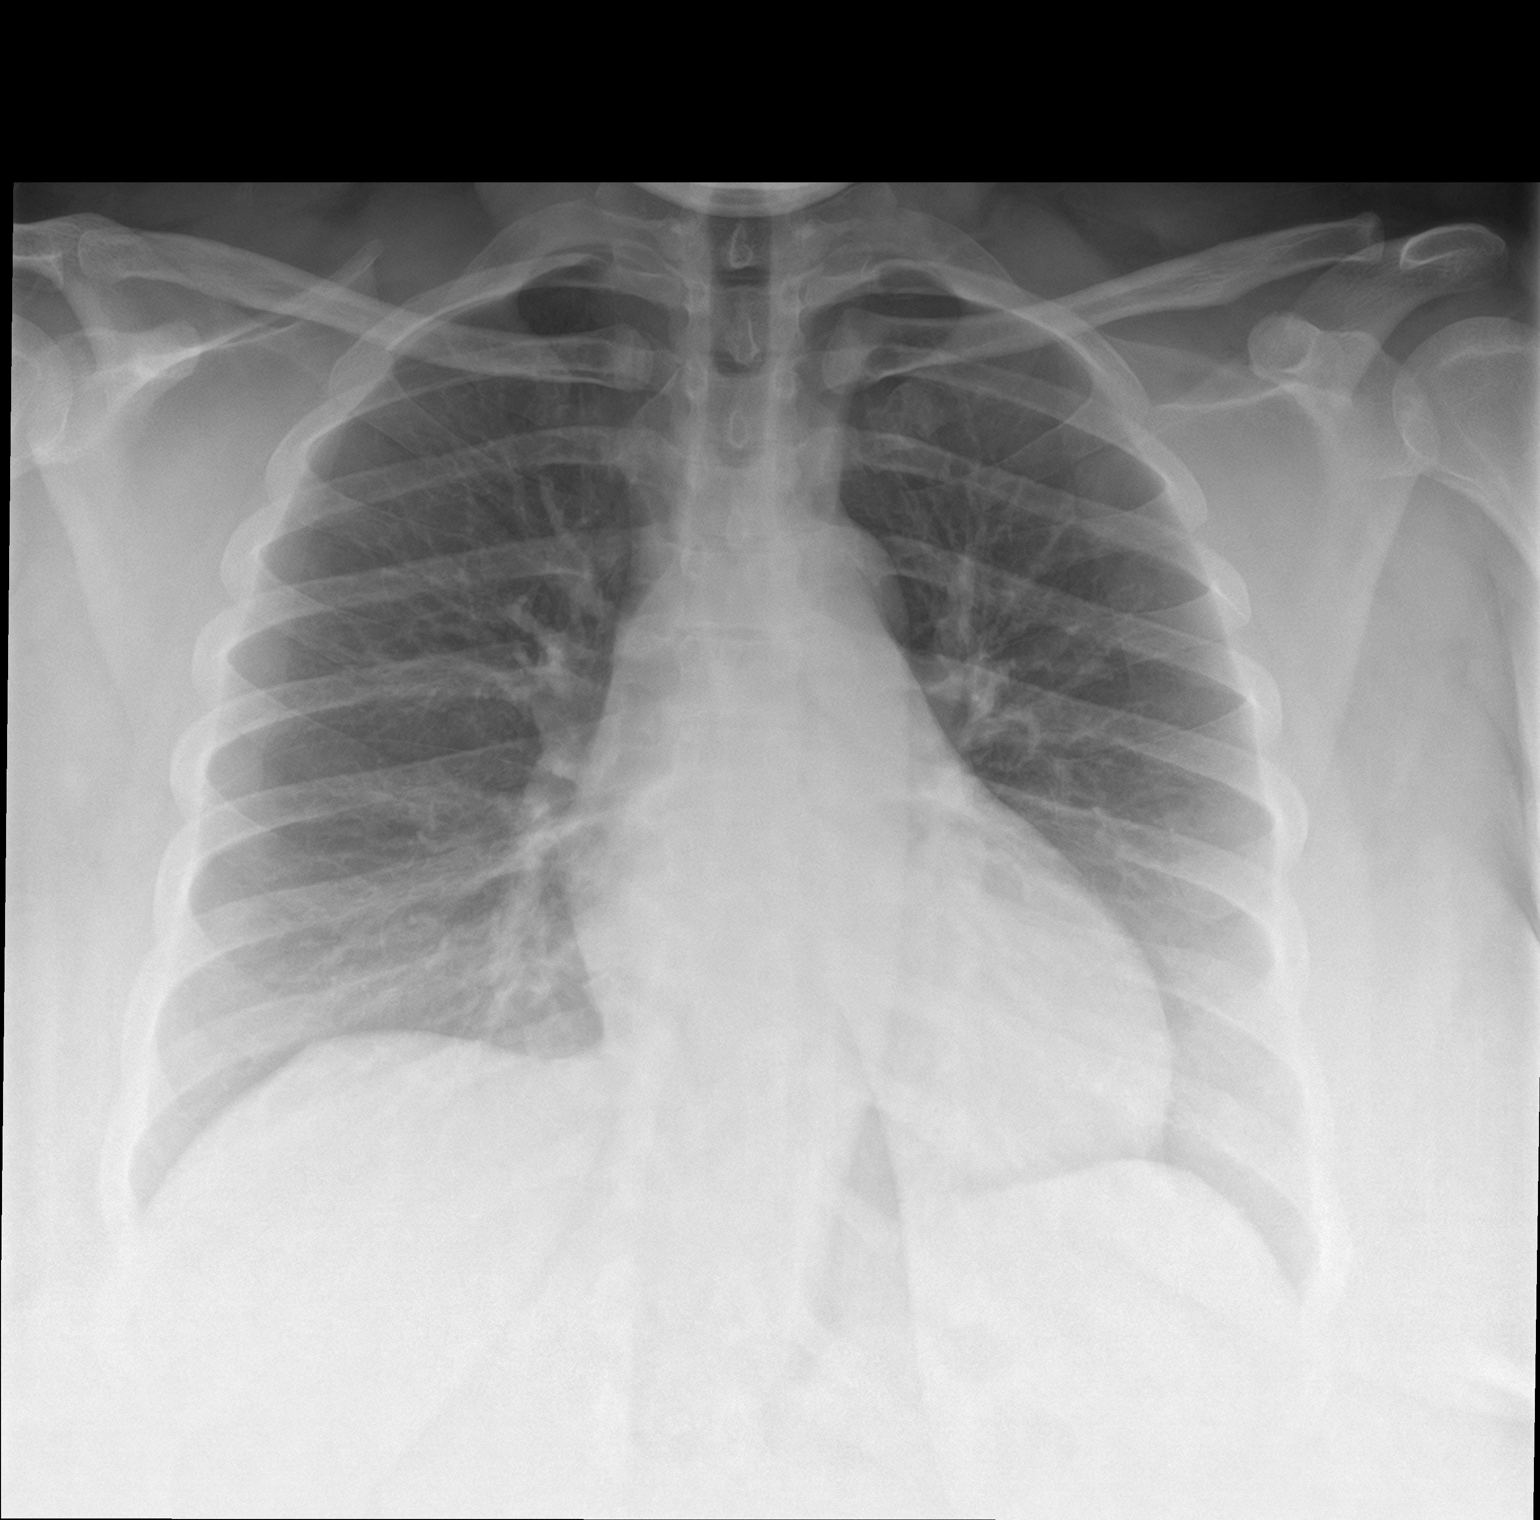

[chest lat]
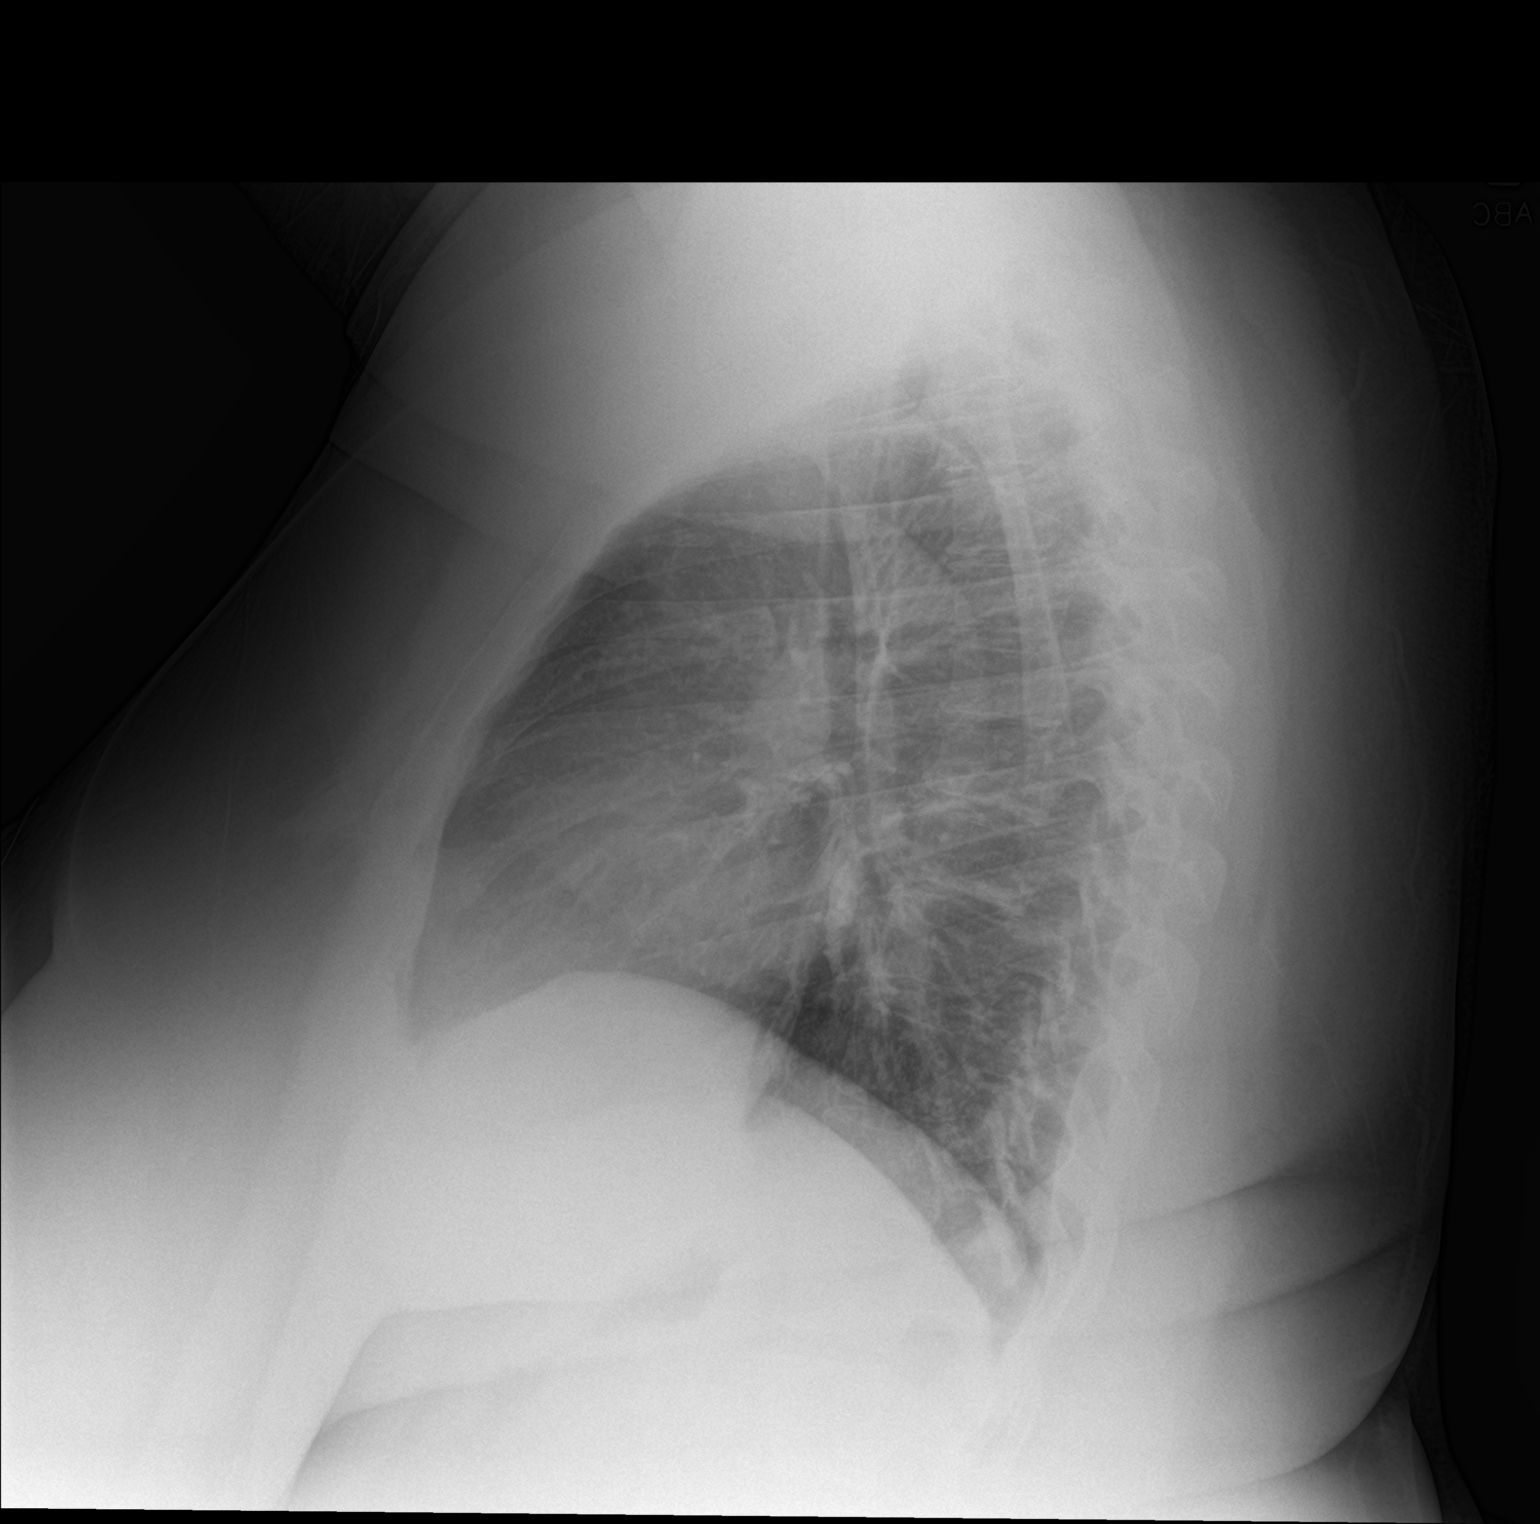

[2 of 2 positions shown; findings below may reference images not displayed]

FINDINGS: Mediastinum hilar structures normal. Borderline cardiomegaly. No
pulmonary venous congestion. No focal infiltrate. No pleural
effusion or pneumothorax. No acute bony abnormality.
IMPRESSION: Borderline cardiomegaly. No pulmonary venous congestion. No acute
infiltrate.

## 2020-11-09 ENCOUNTER — Emergency Department
Admission: EM | Admit: 2020-11-09 | Discharge: 2020-11-09 | Disposition: A | Discharge status: Home / Self Care | Payer: Medicaid Other | Attending: Emergency Medicine | Admitting: Emergency Medicine

## 2020-11-09 ENCOUNTER — Encounter: Payer: Self-pay | Admitting: Emergency Medicine

## 2020-11-09 ENCOUNTER — Emergency Department: Payer: Medicaid Other

## 2020-11-09 ENCOUNTER — Other Ambulatory Visit: Payer: Self-pay

## 2020-11-09 DIAGNOSIS — Z79899 Other long term (current) drug therapy: Secondary | ICD-10-CM | POA: Diagnosis not present

## 2020-11-09 DIAGNOSIS — M778 Other enthesopathies, not elsewhere classified: Secondary | ICD-10-CM

## 2020-11-09 DIAGNOSIS — M70841 Other soft tissue disorders related to use, overuse and pressure, right hand: Secondary | ICD-10-CM | POA: Diagnosis not present

## 2020-11-09 DIAGNOSIS — Y939 Activity, unspecified: Secondary | ICD-10-CM | POA: Diagnosis not present

## 2020-11-09 DIAGNOSIS — I10 Essential (primary) hypertension: Secondary | ICD-10-CM | POA: Insufficient documentation

## 2020-11-09 DIAGNOSIS — M79644 Pain in right finger(s): Secondary | ICD-10-CM | POA: Diagnosis present

## 2020-11-09 MED ORDER — TRAMADOL HCL 50 MG PO TABS: 50.0000 mg | ORAL_TABLET | Freq: Four times a day (QID) | ORAL | 0 refills | Status: DC | PRN

## 2020-11-09 NOTE — ED Provider Notes (Signed)
[facility] Emergency Department Provider Note ____________________________________________  Time seen: Approximately 11:55 AM  I have reviewed the triage vital signs and the nursing notes.   HISTORY  Chief Complaint Joint Swelling    HPI Mary Sherman is a 35 y.o. female who presents to the emergency department for evaluation and treatment of right index finger pain.  No specific injury.  She has never had pain like this before. Thing she can think of that she has done lately is braid her child hair.  No alleviating measures attempted prior to arrival  Past Medical History:  Diagnosis Date   DVT (deep venous thrombosis) (HCC)    Herpes genitalia    no current outbreaks   HSV (herpes simplex virus) infection    Hypertension 2014   Obesity affecting pregnancy     Patient Active Problem List   Diagnosis Date Noted   Antepartum hypertension 08/19/2016   History of maternal deep vein thrombosis (DVT) 08/19/2016   Obesity affecting pregnancy 08/19/2016   Fibroid 08/19/2016   First trimester screening    Sickle cell trait ([place])     Past Surgical History:  Procedure Laterality Date   CHOLECYSTECTOMY  2007    Prior to Admission medications   Medication Sig Start Date End Date Taking? Authorizing Provider  traMADol (ULTRAM) 50 MG tablet Take 1 tablet (50 mg total) by mouth every 6 (six) hours as needed. 11/09/20  Yes Vanilla Heatherington B, FNP  chlorothiazide (DIURIL) 250 MG tablet Take 250 mg by mouth every morning. Reported on 09/04/2015    [provider]  metoCLOPramide (REGLAN) 10 MG tablet Take 1 tablet (10 mg total) by mouth every 8 (eight) hours as needed for up to 5 days for nausea or vomiting. 06/05/19 06/10/19  Arta Silence, MD  ondansetron (ZOFRAN ODT) 8 MG disintegrating tablet Take 1 tablet (8 mg total) by mouth every 8 (eight) hours as needed for nausea or vomiting. 06/05/19   Arta Silence, MD  ranitidine (ZANTAC) 150 MG  tablet Take 1 tablet (150 mg total) by mouth 2 (two) times daily. 12/31/16 12/31/17  Benjaman Kindler, MD  rivaroxaban (XARELTO) 20 MG TABS tablet Take 20 mg by mouth daily with supper.    [provider]    Allergies Benadryl [diphenhydramine]  Family History  Problem Relation Age of Onset   Hypertension Mother    Hypertension Sister     Social History Social History   Tobacco Use   Smoking status: Never   Smokeless tobacco: Never  Substance Use Topics   Alcohol use: Yes    Comment: occ   Drug use: Yes    Types: Marijuana    Comment: not during pregnancy    Review of Systems Constitutional: Negative for fever. Cardiovascular: Negative for chest pain. Respiratory: Negative for shortness of breath. Musculoskeletal: Positive for right index finger pain Skin: No open wounds or lesions Neurological: Negative for decrease in sensation  ____________________________________________   PHYSICAL EXAM:  VITAL SIGNS: ED Triage Vitals [11/09/20 1113]  Enc Vitals Group     BP (!) 162/115     Pulse Rate 85     Resp 20     Temp 98 F (36.7 C)     Temp Source Oral     SpO2 98 %     Weight (!) 340 lb (154.2 kg)     Height 5\' 4"  (1.626 m)     Head Circumference      Peak Flow  Pain Score 9     Pain Loc      Pain Edu?      Excl. in [place]?     Constitutional: Alert and oriented. Well appearing and in no acute distress. Eyes: Conjunctivae are clear without discharge or drainage Head: Atraumatic Neck: Supple Respiratory: No cough. Respirations are even and unlabored. Musculoskeletal: Limited flexion of the right index finger secondary to pain Neurologic: No motor or sensory dysfunction of the right index finger Skin: No erythema or open wound lesion Psychiatric: Affect and behavior are appropriate.  ____________________________________________   LABS (all labs ordered are listed, but only abnormal results are displayed)  Labs Reviewed - No data to  display ____________________________________________  RADIOLOGY  Image of the right index finger negative for acute concerns.  I, Sherrie George, personally viewed and evaluated these images (plain radiographs) as part of my medical decision making, as well as reviewing the written report by the radiologist.  DG Finger Index Right  Result Date: 11/09/2020 CLINICAL DATA:  Right index finger pain.  No injury. EXAM: RIGHT INDEX FINGER 2+V COMPARISON:  None. FINDINGS: There is no evidence of fracture or dislocation. There is no evidence of arthropathy or other focal bone abnormality. Soft tissues are unremarkable. IMPRESSION: Negative. Electronically Signed   By: Marin Olp M.D.   On: 11/09/2020 12:28   ____________________________________________   PROCEDURES  Procedures  ____________________________________________   INITIAL IMPRESSION / ASSESSMENT AND PLAN / ED COURSE  Mary Sherman is a 35 y.o. who presents to the emergency department for treatment and evaluation of nontraumatic right index finger pain.  Image is reassuring.  Pain is likely secondary to tendinitis after repetitive motion while braiding her child's hair.  We will treat with aluminum foam splinting and tramadol for a few days.  Patient advised to follow-up with her primary care provider if not improving over the week.  Medications - No data to display  Pertinent labs & imaging results that were available during my care of the patient were reviewed by me and considered in my medical decision making (see chart for details).   _________________________________________   FINAL CLINICAL IMPRESSION(S) / ED DIAGNOSES  Final diagnoses:  Tendonitis of finger    ED Discharge Orders          Ordered    traMADol (ULTRAM) 50 MG tablet  Every 6 hours PRN        11/09/20 1307             If controlled substance prescribed during this visit, 12 month history viewed on the [place] prior to issuing an initial  prescription for Schedule II or III opiod.    Victorino Dike, FNP 11/09/20 1931    Naaman Plummer, MD 11/10/20 [phone]

## 2020-11-09 NOTE — ED Notes (Signed)
See triage note  Presents with joint swelling to right index finger  Notice area 2 days ago  Denies any injury

## 2020-11-09 NOTE — ED Triage Notes (Signed)
Pt reports that for the last two days, her right index finger at the joint closet to the palm is hurting her and is swollen, she reports that it hurts to move it. Denies any injury to her finger.

## 2020-11-09 NOTE — Discharge Instructions (Addendum)
Wear the splint for then next week.  Follow up with your primary care provider if not improving over the next week.  Return to the ER for symptoms that change or worsen if unable to schedule an appointment.

## 2021-04-10 ENCOUNTER — Other Ambulatory Visit: Payer: Self-pay

## 2021-04-10 ENCOUNTER — Emergency Department
Admission: EM | Admit: 2021-04-10 | Discharge: 2021-04-10 | Disposition: A | Discharge status: Home / Self Care | Payer: Medicaid Other | Attending: Emergency Medicine | Admitting: Emergency Medicine

## 2021-04-10 ENCOUNTER — Encounter: Payer: Self-pay | Admitting: Emergency Medicine

## 2021-04-10 DIAGNOSIS — Z7901 Long term (current) use of anticoagulants: Secondary | ICD-10-CM | POA: Insufficient documentation

## 2021-04-10 DIAGNOSIS — I10 Essential (primary) hypertension: Secondary | ICD-10-CM | POA: Diagnosis not present

## 2021-04-10 DIAGNOSIS — L02412 Cutaneous abscess of left axilla: Secondary | ICD-10-CM | POA: Insufficient documentation

## 2021-04-10 MED ORDER — LIDOCAINE HCL (PF) 1 % IJ SOLN
5.0000 mL | Freq: Once | INTRAMUSCULAR | Status: DC
  Filled 2021-04-10: qty 5

## 2021-04-10 MED ORDER — HYDROCODONE-ACETAMINOPHEN 5-325 MG PO TABS: 1.0000 | ORAL_TABLET | Freq: Three times a day (TID) | ORAL | 0 refills | Status: AC | PRN

## 2021-04-10 MED ORDER — SULFAMETHOXAZOLE-TRIMETHOPRIM 800-160 MG PO TABS: 1.0000 | ORAL_TABLET | Freq: Two times a day (BID) | ORAL | 0 refills | Status: AC

## 2021-04-10 MED ORDER — SULFAMETHOXAZOLE-TRIMETHOPRIM 800-160 MG PO TABS
1.0000 | ORAL_TABLET | Freq: Once | ORAL | Status: AC
  Administered 2021-04-10: 1 via ORAL
  Filled 2021-04-10: qty 1

## 2021-04-10 NOTE — ED Triage Notes (Signed)
Pt comes into the ED via POV c/o left axillary abscess that has been there for a week.  Pt in NAD.

## 2021-04-10 NOTE — ED Notes (Signed)
Dc ppw provided. Followup up given. RX info given. Pt provides verbal consent for dc. Pt off unit on foot

## 2021-04-10 NOTE — Discharge Instructions (Addendum)
Keep the wound clean, dry, and covered.  Apply warm compresses over the dressing to help promote healing.  Take antibiotic as prescribed.  Follow with your primary provider for interim wound care.

## 2021-04-10 NOTE — ED Provider Notes (Signed)
[facility] Emergency Department Provider Note ____________________________________________  Time seen: 1259  I have reviewed the triage vital signs and the nursing notes.  HISTORY  Chief Complaint  Abscess  HPI Mary Sherman is a 35 y.o. female Mary Sherman to the ED with tenderness and fullness to the left axilla is been there for about a week.  She has been applying warm compresses to promote healing.  She denies any spontaneous purulent drainage.  Past Medical History:  Diagnosis Date   DVT (deep venous thrombosis) (HCC)    Herpes genitalia    no current outbreaks   HSV (herpes simplex virus) infection    Hypertension 2014   Obesity affecting pregnancy     Patient Active Problem List   Diagnosis Date Noted   Antepartum hypertension 08/19/2016   History of maternal deep vein thrombosis (DVT) 08/19/2016   Obesity affecting pregnancy 08/19/2016   Fibroid 08/19/2016   First trimester screening    Sickle cell trait ([place])     Past Surgical History:  Procedure Laterality Date   CHOLECYSTECTOMY  2007    Prior to Admission medications   Medication Sig Start Date End Date Taking? Authorizing Provider  sulfamethoxazole-trimethoprim (BACTRIM DS) 800-160 MG tablet Take 1 tablet by mouth 2 (two) times daily for 10 days. 04/10/21 04/20/21 Yes Laury Huizar, Dannielle Karvonen, PA-C  chlorothiazide (DIURIL) 250 MG tablet Take 250 mg by mouth every morning. Reported on 09/04/2015    [provider]  metoCLOPramide (REGLAN) 10 MG tablet Take 1 tablet (10 mg total) by mouth every 8 (eight) hours as needed for up to 5 days for nausea or vomiting. 06/05/19 06/10/19  Arta Silence, MD  ondansetron (ZOFRAN ODT) 8 MG disintegrating tablet Take 1 tablet (8 mg total) by mouth every 8 (eight) hours as needed for nausea or vomiting. 06/05/19   Arta Silence, MD  ranitidine (ZANTAC) 150 MG tablet Take 1 tablet (150 mg total) by mouth 2 (two) times daily. 12/31/16 12/31/17   Benjaman Kindler, MD  rivaroxaban (XARELTO) 20 MG TABS tablet Take 20 mg by mouth daily with supper.    [provider]  traMADol (ULTRAM) 50 MG tablet Take 1 tablet (50 mg total) by mouth every 6 (six) hours as needed. 11/09/20   Victorino Dike, FNP    Allergies Benadryl [diphenhydramine]  Family History  Problem Relation Age of Onset   Hypertension Mother    Hypertension Sister     Social History Social History   Tobacco Use   Smoking status: Never   Smokeless tobacco: Never  Substance Use Topics   Alcohol use: Yes    Comment: occ   Drug use: Yes    Types: Marijuana    Comment: not during pregnancy    Review of Systems  Constitutional: Negative for fever. Cardiovascular: Negative for chest pain. Respiratory: Negative for shortness of breath. Gastrointestinal: Negative for abdominal pain, vomiting and diarrhea. Genitourinary: Negative for dysuria. Musculoskeletal: Negative for back pain. Skin: Negative for rash.  Left axilla abscess as above  neurological: Negative for headaches, focal weakness or numbness. ____________________________________________  PHYSICAL EXAM:  VITAL SIGNS: ED Triage Vitals  Enc Vitals Group     BP 04/10/21 1240 (!) 176/116     Pulse Rate 04/10/21 1240 83     Resp 04/10/21 1240 17     Temp 04/10/21 1240 98.3 F (36.8 C)     Temp Source 04/10/21 1240 Oral     SpO2 04/10/21 1240 98 %  Weight 04/10/21 1239 (!) 339 lb 15.2 oz (154.2 kg)     Height 04/10/21 1239 5\' 4"  (1.626 m)     Head Circumference --      Peak Flow --      Pain Score 04/10/21 1239 10     Pain Loc --      Pain Edu? --      Excl. in [place]? --     Constitutional: Alert and oriented. Well appearing and in no distress. Head: Normocephalic and atraumatic. Eyes: Conjunctivae are normal.  Normal extraocular movements Cardiovascular: Normal rate, regular rhythm. Normal distal pulses. Respiratory: Normal respiratory effort.  Musculoskeletal: Nontender with  normal range of motion in all extremities.  Neurologic:  Normal gait without ataxia. Normal speech and language. No gross focal neurologic deficits are appreciated. Skin:  Skin is warm, dry and intact. No rash noted.  Left axilla with a area of erythema surrounding a 2 cm area of induration.  Pointing without spontaneous purulent drainage is appreciated. Psychiatric: Mood and affect are normal. Patient exhibits appropriate insight and judgment. ____________________________________________    {LABS (pertinent positives/negatives)  ____________________________________________  {EKG  ____________________________________________   RADIOLOGY Official radiology report(s): No results found. ____________________________________________  PROCEDURES  Bactrim DS 1 PO  .Marland KitchenIncision and Drainage  Date/Time: 04/10/2021 1:01 PM Performed by: Melvenia Needles, PA-C Authorized by: Melvenia Needles, PA-C   Consent:    Consent obtained:  Verbal   Consent given by:  Patient   Risks, benefits, and alternatives were discussed: yes     Risks discussed:  Bleeding, incomplete drainage and pain   Alternatives discussed:  Delayed treatment Universal protocol:    Site/side marked: yes     Patient identity confirmed:  Verbally with patient Location:    Type:  Abscess   Size:  3   Location:  Upper extremity   Upper extremity location: Left Axilla. Pre-procedure details:    Skin preparation:  Povidone-iodine Sedation:    Sedation type:  None Anesthesia:    Anesthesia method:  Local infiltration   Local anesthetic:  Lidocaine 1% w/o epi Procedure type:    Complexity:  Simple Procedure details:    Ultrasound guidance: no     Needle aspiration: no     Incision types:  Single straight   Incision depth:  Subcutaneous   Wound management:  Probed and deloculated and irrigated with saline   Drainage:  Bloody   Drainage amount:  Moderate   Wound treatment:  Wound left open   Packing  materials:  1/4 in iodoform gauze   Amount 1/4" iodoform:  3 Post-procedure details:    Procedure completion:  Tolerated well, no immediate complications ____________________________________________   INITIAL IMPRESSION / ASSESSMENT AND PLAN / ED COURSE  As part of my medical decision making, I reviewed the following data within the electronic MEDICAL RECORD NUMBER Notes from prior ED visits and [place] Controlled Substance Database   Patient with ED evaluation of a left axilla abscess developing over the last week.  Patient presents in no acute distress.  She consents to an I&D procedure, which is performed.  The small local abscess is drained without significant expression of any purulent material.  The small skin wound is packed with iodoform dressing, patient discharged to wound care instructions and supplies.  Prescription for Bactrim along with small prescription for Norco was provided for her benefit.  She will follow-up with the primary provider or return to the ED for interim wound check.  Mary Sherman  Mary Sherman was evaluated in Emergency Department on 04/10/2021 for the symptoms described in the history of present illness. She was evaluated in the context of the global COVID-19 pandemic, which necessitated consideration that the patient might be at risk for infection with the SARS-CoV-2 virus that causes COVID-19. Institutional protocols and algorithms that pertain to the evaluation of patients at risk for COVID-19 are in a state of rapid change based on information released by regulatory bodies including the CDC and federal and state organizations. These policies and algorithms were followed during the patient's care in the ED.  I reviewed the patient's prescription history over the last 12 months in the multi-state controlled substances database(s) that includes [place], [place], [place], [place], [place], [place], [place], [place], [place], [place], [place], [place],  [place], and [place].  Results were notable for no Rx history. ____________________________________________  FINAL CLINICAL IMPRESSION(S) / ED DIAGNOSES  Final diagnoses:  Abscess of left axilla      Carmie End, Dannielle Karvonen, PA-C 04/10/21 1421    Lavonia Drafts, MD 04/10/21 1442

## 2021-10-14 ENCOUNTER — Encounter: Payer: Self-pay | Admitting: *Deleted

## 2021-10-14 ENCOUNTER — Other Ambulatory Visit: Payer: Self-pay

## 2021-10-14 ENCOUNTER — Emergency Department
Admission: EM | Admit: 2021-10-14 | Discharge: 2021-10-14 | Disposition: A | Discharge status: Home / Self Care | Payer: Medicaid Other | Attending: Emergency Medicine | Admitting: Emergency Medicine

## 2021-10-14 DIAGNOSIS — H9202 Otalgia, left ear: Secondary | ICD-10-CM | POA: Insufficient documentation

## 2021-10-14 DIAGNOSIS — J029 Acute pharyngitis, unspecified: Secondary | ICD-10-CM | POA: Diagnosis present

## 2021-10-14 DIAGNOSIS — I10 Essential (primary) hypertension: Secondary | ICD-10-CM | POA: Diagnosis not present

## 2021-10-14 DIAGNOSIS — J028 Acute pharyngitis due to other specified organisms: Secondary | ICD-10-CM | POA: Diagnosis not present

## 2021-10-14 DIAGNOSIS — B9789 Other viral agents as the cause of diseases classified elsewhere: Secondary | ICD-10-CM | POA: Diagnosis not present

## 2021-10-14 LAB — GROUP A STREP BY PCR: Group A Strep by PCR: NOT DETECTED

## 2021-10-14 MED ORDER — METHYLPREDNISOLONE 4 MG PO TBPK: ORAL_TABLET | ORAL | 0 refills | Status: DC

## 2021-10-14 MED ORDER — IBUPROFEN 600 MG PO TABS
600.0000 mg | ORAL_TABLET | Freq: Once | ORAL | Status: AC
  Administered 2021-10-14: 600 mg via ORAL
  Filled 2021-10-14: qty 1

## 2021-10-14 MED ORDER — AZITHROMYCIN 250 MG PO TABS: ORAL_TABLET | ORAL | 0 refills | Status: AC

## 2021-10-14 NOTE — ED Provider Notes (Signed)
[facility] Provider Note    Event Date/Time   First MD Initiated Contact with Patient 10/14/21 0215     (approximate)   History   Sore Throat   HPI  Mary Sherman is a 36 y.o. female with a history of hypertension and obesity who presents for evaluation of sore throat.  Patient reports that her symptoms have been ongoing for 4 days.  She is complaining of sore throat and left earache.  Has had a little mild dry cough mostly from postnasal drip.  No chest pain or shortness of breath, no vomiting or diarrhea.     Past Medical History:  Diagnosis Date   DVT (deep venous thrombosis) (HCC)    Herpes genitalia    no current outbreaks   HSV (herpes simplex virus) infection    Hypertension 2014   Obesity affecting pregnancy     Past Surgical History:  Procedure Laterality Date   CHOLECYSTECTOMY  2007     Physical Exam   Triage Vital Signs: ED Triage Vitals  Enc Vitals Group     BP 10/14/21 0006 (!) 149/99     Pulse Rate 10/14/21 0006 75     Resp 10/14/21 0006 20     Temp 10/14/21 0006 98.9 F (37.2 C)     Temp Source 10/14/21 0006 Oral     SpO2 10/14/21 0006 95 %     Weight 10/14/21 0007 (!) 340 lb (154.2 kg)     Height 10/14/21 0007 '5\' 4"'$  (1.626 m)     Head Circumference --      Peak Flow --      Pain Score 10/14/21 0007 10     Pain Loc --      Pain Edu? --      Excl. in [place]? --     Most recent vital signs: Vitals:   10/14/21 0006  BP: (!) 149/99  Pulse: 75  Resp: 20  Temp: 98.9 F (37.2 C)  SpO2: 95%     Constitutional: Alert and oriented. Well appearing and in no apparent distress. HEENT:      Head: Normocephalic and atraumatic.         Eyes: Conjunctivae are normal. Sclera is non-icteric.       Mouth/Throat: Mucous membranes are moist.  Oropharynx is clear, tonsils have no erythema, hypertrophy, exudate.  No signs of peritonsillar abscess.  Floor of the mouth is soft with no induration.  No trismus      Ear: TM  visualized and clear      Neck: Supple with no signs of meningismus. Cardiovascular: Regular rate and rhythm. No murmurs, gallops, or rubs. 2+ symmetrical distal pulses are present in all extremities.  Respiratory: Normal respiratory effort. Lungs are clear to auscultation bilaterally.  Gastrointestinal: Soft, non tender. Musculoskeletal:  No edema, cyanosis, or erythema of extremities. Neurologic: Normal speech and language. Face is symmetric. Moving all extremities. No gross focal neurologic deficits are appreciated. Skin: Skin is warm, dry and intact. No rash noted. Psychiatric: Mood and affect are normal. Speech and behavior are normal.  ED Results / Procedures / Treatments   Labs (all labs ordered are listed, but only abnormal results are displayed) Labs Reviewed  GROUP A STREP BY PCR     EKG  none   RADIOLOGY none   PROCEDURES:  Critical Care performed: No  Procedures    IMPRESSION / MDM / [place] / ED COURSE  I reviewed the triage vital signs  and the nursing notes.   36 y.o. female with a history of hypertension and obesity who presents for evaluation of sore throat and earache x4 days.  Patient is well-appearing in no distress with normal vital signs.  Oropharynx with no exudates, no peritonsillar abscess, no Ludewig's angina, no acute findings.  TMs visualized and clear.  Most likely viral syndrome.  Will discharge home with a Medrol pack and ibuprofen/Tylenol for pain.  Will provide patient with a prescription for azithromycin and recommended taking it if she spikes a fever otherwise supportive care and steroids only.  Recommended close follow-up with primary care doctor and discussed my standard return precautions  [place] ED: Medications  ibuprofen (ADVIL) tablet 600 mg (has no administration in time range)   EMR reviewed none    FINAL CLINICAL IMPRESSION(S) / ED DIAGNOSES   Final diagnoses:  Viral pharyngitis     Rx / DC  Orders   ED Discharge Orders          Ordered    methylPREDNISolone (MEDROL DOSEPAK) 4 MG TBPK tablet        10/14/21 0220    azithromycin (ZITHROMAX Z-PAK) 250 MG tablet        10/14/21 0220             Note:  This document was prepared using Dragon voice recognition software and may include unintentional dictation errors.   Please note:  Patient was evaluated in Emergency Department today for the symptoms described in the history of present illness. Patient was evaluated in the context of the global COVID-19 pandemic, which necessitated consideration that the patient might be at risk for infection with the SARS-CoV-2 virus that causes COVID-19. Institutional protocols and algorithms that pertain to the evaluation of patients at risk for COVID-19 are in a state of rapid change based on information released by regulatory bodies including the CDC and federal and state organizations. These policies and algorithms were followed during the patient's care in the ED.  Some ED evaluations and interventions may be delayed as a result of limited staffing during the pandemic.       Alfred Levins, [place], MD 10/14/21 [phone]

## 2021-10-14 NOTE — ED Triage Notes (Signed)
Pt has sore throat and left earache.   No otc meds.  Pt alert speech clear

## 2021-10-14 NOTE — Discharge Instructions (Addendum)
Take the steroids as prescribed.  If you spike a fever start taking antibiotics.  Follow-up with your doctor in 3 days.  Return to the hospital for worsening sore throat, difficulty swallowing, fever, chest pain, shortness of breath

## 2021-12-03 DIAGNOSIS — I208 Other forms of angina pectoris: Secondary | ICD-10-CM | POA: Diagnosis not present

## 2021-12-03 DIAGNOSIS — R079 Chest pain, unspecified: Secondary | ICD-10-CM | POA: Diagnosis not present

## 2021-12-07 ENCOUNTER — Other Ambulatory Visit: Payer: Self-pay | Admitting: Internal Medicine

## 2021-12-07 DIAGNOSIS — I208 Other forms of angina pectoris: Secondary | ICD-10-CM

## 2021-12-07 DIAGNOSIS — R079 Chest pain, unspecified: Secondary | ICD-10-CM

## 2021-12-08 ENCOUNTER — Ambulatory Visit: Payer: Medicaid Other | Admitting: Podiatry

## 2021-12-08 DIAGNOSIS — B351 Tinea unguium: Secondary | ICD-10-CM

## 2021-12-08 MED ORDER — TERBINAFINE HCL 250 MG PO TABS: 250.0000 mg | ORAL_TABLET | Freq: Every day | ORAL | 0 refills | Status: DC

## 2021-12-08 NOTE — Progress Notes (Signed)
   Subjective: 36 y.o. female presenting today as a new patient for evaluation of discoloration with thickening to the bilateral toenails.  Patient states that they have been like this for several years.  She actually went to the doctor when she was in high school and the nails were removed but they grew back even worse.  She presents for further treatment and evaluation  Past Medical History:  Diagnosis Date   DVT (deep venous thrombosis) ([place])    Herpes genitalia    no current outbreaks   HSV (herpes simplex virus) infection    Hypertension 2014   Obesity affecting pregnancy     Past Surgical History:  Procedure Laterality Date   CHOLECYSTECTOMY  2007    Allergies  Allergen Reactions   Benadryl [Diphenhydramine] Hives    Objective: Physical Exam General: The patient is alert and oriented x3 in no acute distress.  Dermatology: Hyperkeratotic, discolored, thickened, onychodystrophy noted bilateral feet. Skin is warm, dry and supple bilateral lower extremities. Negative for open lesions or macerations.  Vascular: Palpable pedal pulses bilaterally. No edema or erythema noted. Capillary refill within normal limits.  Neurological: Epicritic and protective threshold grossly intact bilaterally.   Musculoskeletal Exam: No pedal deformity noted  Assessment: #1 Onychomycosis of toenails  Plan of Care:  #1 Patient was evaluated. #2  Today we discussed different treatment options including oral, topical, and laser antifungal treatment modalities.  We discussed their efficacies and side effects.  Patient opts for oral antifungal treatment modality #3 prescription for Lamisil 250 mg #90 daily. Pt denies a history of liver pathology or symptoms.   #4  Order placed for hepatic function panel.  Discontinue if abnormal  #5 return to clinic 6 months   Edrick Kins, DPM [facility]  Dr. Edrick Kins, DPM    [address], [place] [address]                Office [phone]  Fax [phone]

## 2021-12-11 ENCOUNTER — Telehealth (HOSPITAL_COMMUNITY): Payer: Self-pay | Admitting: Emergency Medicine

## 2021-12-11 DIAGNOSIS — R079 Chest pain, unspecified: Secondary | ICD-10-CM

## 2021-12-11 MED ORDER — IVABRADINE HCL 5 MG PO TABS: 15.0000 mg | ORAL_TABLET | Freq: Once | ORAL | 0 refills | Status: AC

## 2021-12-11 MED ORDER — METOPROLOL TARTRATE 100 MG PO TABS: 100.0000 mg | ORAL_TABLET | Freq: Once | ORAL | 0 refills | Status: DC

## 2021-12-11 NOTE — Telephone Encounter (Signed)
Reaching out to patient to offer assistance regarding upcoming cardiac imaging study; pt verbalizes understanding of appt date/time, parking situation and where to check in, pre-test NPO status and medications ordered, and verified current allergies; name and call back number provided for further questions should they arise Mary Bond RN Navigator Cardiac Imaging [facility] [phone] office [phone] cell  Arrival 1030 Denies iv issues '100mg'$  metoprolol + '15mg'$  ivabradine

## 2021-12-11 NOTE — Telephone Encounter (Signed)
Attempted to call patient regarding upcoming cardiac CT appointment. °Left message on voicemail with name and callback number °Shaketha Jeon RN Navigator Cardiac Imaging °[facility] °[phone] Office °[phone] Cell ° °

## 2021-12-12 LAB — HEPATIC FUNCTION PANEL
ALT: 10 IU/L (ref 0–32)
AST: 14 IU/L (ref 0–40)
Albumin: 3.8 g/dL — ABNORMAL LOW (ref 3.9–4.9)
Alkaline Phosphatase: 67 IU/L (ref 44–121)
Bilirubin Total: <0.2 mg/dL (ref 0.0–1.2)
Bilirubin, Direct: <0.1 mg/dL (ref 0.00–0.40)
Total Protein: 7 g/dL (ref 6.0–8.5)

## 2021-12-14 ENCOUNTER — Ambulatory Visit: Admission: RE | Admit: 2021-12-14 | Payer: Medicaid Other | Source: Ambulatory Visit

## 2021-12-16 ENCOUNTER — Telehealth: Payer: Self-pay | Admitting: Podiatry

## 2021-12-16 NOTE — Telephone Encounter (Signed)
Please contact patient for lab results

## 2021-12-16 NOTE — Telephone Encounter (Signed)
Lab results , yes,ready to view in epic

## 2021-12-16 NOTE — Telephone Encounter (Signed)
Spoke with patient.  Reviewed results.  Return to clinic as needed

## 2021-12-16 NOTE — Telephone Encounter (Signed)
Spoke with patient she is waiting to start medication she was given, have you received her lab results back ?

## 2021-12-23 DIAGNOSIS — I208 Other forms of angina pectoris: Secondary | ICD-10-CM | POA: Diagnosis not present

## 2021-12-28 DIAGNOSIS — Z7251 High risk heterosexual behavior: Secondary | ICD-10-CM | POA: Diagnosis not present

## 2021-12-28 DIAGNOSIS — N760 Acute vaginitis: Secondary | ICD-10-CM | POA: Diagnosis not present

## 2021-12-31 ENCOUNTER — Inpatient Hospital Stay: Admission: RE | Admit: 2021-12-31 | Payer: Medicaid Other | Source: Ambulatory Visit

## 2022-01-08 ENCOUNTER — Telehealth (HOSPITAL_COMMUNITY): Payer: Self-pay | Admitting: Emergency Medicine

## 2022-01-08 DIAGNOSIS — R079 Chest pain, unspecified: Secondary | ICD-10-CM

## 2022-01-08 MED ORDER — IVABRADINE HCL 5 MG PO TABS: 15.0000 mg | ORAL_TABLET | Freq: Once | ORAL | 0 refills | Status: AC

## 2022-01-08 MED ORDER — METOPROLOL TARTRATE 100 MG PO TABS: 100.0000 mg | ORAL_TABLET | Freq: Once | ORAL | 0 refills | Status: AC

## 2022-01-08 NOTE — Telephone Encounter (Signed)
Reaching out to patient to offer assistance regarding upcoming cardiac imaging study; pt verbalizes understanding of appt date/time, parking situation and where to check in, pre-test NPO status and medications ordered, and verified current allergies; name and call back number provided for further questions should they arise Marchia Bond RN Navigator Cardiac Imaging [facility] [phone] office [phone] cell  Arrival [address] '100mg'$  metop + '15mg'$  ivabradine Denies iv issues  Email sent

## 2022-01-11 ENCOUNTER — Ambulatory Visit: Admission: RE | Admit: 2022-01-11 | Payer: Medicaid Other | Source: Ambulatory Visit

## 2022-02-22 DIAGNOSIS — N898 Other specified noninflammatory disorders of vagina: Secondary | ICD-10-CM | POA: Diagnosis not present

## 2022-02-22 DIAGNOSIS — Z7251 High risk heterosexual behavior: Secondary | ICD-10-CM | POA: Diagnosis not present

## 2022-03-03 ENCOUNTER — Other Ambulatory Visit: Payer: Self-pay | Admitting: Podiatry

## 2022-03-03 ENCOUNTER — Telehealth: Payer: Self-pay | Admitting: Podiatry

## 2022-03-03 DIAGNOSIS — Z79899 Other long term (current) drug therapy: Secondary | ICD-10-CM

## 2022-03-03 DIAGNOSIS — B351 Tinea unguium: Secondary | ICD-10-CM

## 2022-03-03 MED ORDER — TERBINAFINE HCL 250 MG PO TABS: 250.0000 mg | ORAL_TABLET | Freq: Every day | ORAL | 0 refills | Status: DC

## 2022-03-03 NOTE — Telephone Encounter (Signed)
Pt called to request a Rx refill on her Lamisil or wanted to know if she needs to be seen first. She stated she has a few days left before Rx runs out. Please advise

## 2022-03-03 NOTE — Telephone Encounter (Signed)
New order placed for hepatic function panel/blood work.  If she could go and get updated blood work to make sure her liver is fine with the medication. I'll also send in a refill of the Lamisil.  Please notify patient.  Thanks, Dr. Amalia Hailey

## 2022-03-05 DIAGNOSIS — N898 Other specified noninflammatory disorders of vagina: Secondary | ICD-10-CM | POA: Diagnosis not present

## 2022-03-05 NOTE — Telephone Encounter (Signed)
[facility] is calling for hepatic function panel that was supposed to be sent to [facility] location, not there and patient is waiting.   Placed and faxed order to [place].

## 2022-03-06 LAB — HEPATIC FUNCTION PANEL
ALT: 7 IU/L (ref 0–32)
AST: 17 IU/L (ref 0–40)
Albumin: 3.7 g/dL — ABNORMAL LOW (ref 3.9–4.9)
Alkaline Phosphatase: 68 IU/L (ref 44–121)
Bilirubin Total: 0.2 mg/dL (ref 0.0–1.2)
Bilirubin, Direct: <0.1 mg/dL (ref 0.00–0.40)
Total Protein: 7 g/dL (ref 6.0–8.5)

## 2022-03-07 NOTE — Telephone Encounter (Signed)
Thank you! - Dr. Amalia Hailey

## 2022-03-15 ENCOUNTER — Telehealth: Payer: Self-pay | Admitting: *Deleted

## 2022-03-15 NOTE — Telephone Encounter (Signed)
Patient is calling for lab results(in epic) so that she may continue the terbinafine medication

## 2022-03-16 NOTE — Telephone Encounter (Signed)
Spoke with patient.  Reviewed lab results.  She is okay to take the terbinafine 250 mg #90 daily.-Dr. Amalia Hailey

## 2022-03-30 ENCOUNTER — Emergency Department
Admission: EM | Admit: 2022-03-30 | Discharge: 2022-03-30 | Disposition: A | Discharge status: Home / Self Care | Payer: Medicaid Other | Attending: Emergency Medicine | Admitting: Emergency Medicine

## 2022-03-30 ENCOUNTER — Encounter: Payer: Self-pay | Admitting: Emergency Medicine

## 2022-03-30 ENCOUNTER — Other Ambulatory Visit: Payer: Self-pay

## 2022-03-30 DIAGNOSIS — J0100 Acute maxillary sinusitis, unspecified: Secondary | ICD-10-CM | POA: Insufficient documentation

## 2022-03-30 DIAGNOSIS — H1089 Other conjunctivitis: Secondary | ICD-10-CM | POA: Diagnosis not present

## 2022-03-30 DIAGNOSIS — H1031 Unspecified acute conjunctivitis, right eye: Secondary | ICD-10-CM

## 2022-03-30 DIAGNOSIS — H6500 Acute serous otitis media, unspecified ear: Secondary | ICD-10-CM

## 2022-03-30 DIAGNOSIS — Z20822 Contact with and (suspected) exposure to covid-19: Secondary | ICD-10-CM | POA: Diagnosis not present

## 2022-03-30 DIAGNOSIS — R059 Cough, unspecified: Secondary | ICD-10-CM | POA: Diagnosis present

## 2022-03-30 DIAGNOSIS — H6501 Acute serous otitis media, right ear: Secondary | ICD-10-CM | POA: Diagnosis not present

## 2022-03-30 LAB — RESP PANEL BY RT-PCR (FLU A&B, COVID) ARPGX2
Influenza A by PCR: NEGATIVE
Influenza B by PCR: NEGATIVE
SARS Coronavirus 2 by RT PCR: NEGATIVE

## 2022-03-30 MED ORDER — PREDNISONE 10 MG PO TABS: 30.0000 mg | ORAL_TABLET | Freq: Every day | ORAL | 0 refills | Status: AC

## 2022-03-30 MED ORDER — ACETAMINOPHEN 325 MG PO TABS
650.0000 mg | ORAL_TABLET | Freq: Once | ORAL | Status: AC
  Administered 2022-03-30: 650 mg via ORAL
  Filled 2022-03-30: qty 2

## 2022-03-30 MED ORDER — AMOXICILLIN 875 MG PO TABS
875.0000 mg | ORAL_TABLET | Freq: Two times a day (BID) | ORAL | 0 refills | Status: AC
Start: 2022-03-30 — End: ?

## 2022-03-30 MED ORDER — SULFACETAMIDE SODIUM 10 % OP SOLN: 2.0000 [drp] | Freq: Four times a day (QID) | OPHTHALMIC | 0 refills | Status: AC

## 2022-03-30 NOTE — ED Triage Notes (Signed)
Patient ambulatory to triage with steady gait, without difficulty or distress noted; pt reports recent cold symptoms; now with rt earache and rt eye drainage/irriation

## 2022-03-30 NOTE — ED Notes (Signed)
Patient discharged to home per MD order. Patient in stable condition, and deemed medically cleared by ED provider for discharge. Discharge instructions reviewed with patient/family using "Teach Back"; verbalized understanding of medication education and administration, and information about follow-up care. Denies further concerns. ° °

## 2022-03-30 NOTE — ED Provider Notes (Signed)
[facility] Provider Note    Event Date/Time   First MD Initiated Contact with Patient 03/30/22 [phone]     (approximate)   History   Otalgia   HPI Mary Sherman is a 36 y.o. female  with no significant pmh presents to the ER with red draining eye. Ear pain, cough and congestion.  Denies fever/chills/v/d        Physical Exam   Triage Vital Signs: ED Triage Vitals  Enc Vitals Group     BP 03/30/22 0135 (!) 133/92     Pulse Rate 03/30/22 0135 89     Resp 03/30/22 0135 18     Temp 03/30/22 0151 98.7 F (37.1 C)     Temp Source 03/30/22 0151 Oral     SpO2 03/30/22 0135 99 %     Weight 03/30/22 0126 (!) 340 lb (154.2 kg)     Height 03/30/22 0126 '5\' 4"'$  (1.626 m)     Head Circumference --      Peak Flow --      Pain Score 03/30/22 0126 10     Pain Loc --      Pain Edu? --      Excl. in [place]? --     Most recent vital signs: Vitals:   03/30/22 0135 03/30/22 0151  BP: (!) 133/92   Pulse: 89   Resp: 18   Temp:  98.7 F (37.1 C)  SpO2: 99%      General: Awake, no distress.   CV:  Good peripheral perfusion. regular rate and  rhythm Resp:  Normal effort. Lungs cta Abd:  No distention.   Other:  Right eye injected and crusting, perrl eomi, tms red and swollen, worse on the right, neck supple no lymph    ED Results / Procedures / Treatments   Labs (all labs ordered are listed, but only abnormal results are displayed) Labs Reviewed  RESP PANEL BY RT-PCR (FLU A&B, COVID) [id number]     EKG     RADIOLOGY     PROCEDURES:   Procedures   MEDICATIONS ORDERED IN ED: Medications  acetaminophen (TYLENOL) tablet 650 mg (650 mg Oral Given 03/30/22 0304)     IMPRESSION / MDM / ASSESSMENT AND PLAN / ED COURSE  I reviewed the triage vital signs and the nursing notes.                              Differential diagnosis includes, but is not limited to, covid, flu, conjunctivitis, otitis media, sinusitis  Patient's presentation is  most consistent with acute complicated illness / injury requiring diagnostic workup.   Resp panel is reassuring,    PE is consistent with conjunctivitis, otitis media and uri  Patient was given bleph 10 opth gtts, amoxil, and prednisone '30mg'$  qd for 3 days  F/u with her regular doctor if not improving in 3 days, return if worsening, given a work note, discharged in stable condition     FINAL CLINICAL IMPRESSION(S) / ED DIAGNOSES   Final diagnoses:  Acute bacterial conjunctivitis of right eye  Acute maxillary sinusitis, recurrence not specified  Acute serous otitis media, recurrence not specified, unspecified laterality     Rx / DC Orders   ED Discharge Orders          Ordered    sulfacetamide (BLEPH-10) 10 % ophthalmic solution  4 times daily        03/30/22 0748  amoxicillin (AMOXIL) 875 MG tablet  2 times daily        03/30/22 0748    predniSONE (DELTASONE) 10 MG tablet  Daily with breakfast        03/30/22 0748             Note:  This document was prepared using Dragon voice recognition software and may include unintentional dictation errors.    Versie Starks, PA-C 03/30/22 5537    Lavonia Drafts, MD 03/30/22 [phone]

## 2022-04-02 DIAGNOSIS — Z7251 High risk heterosexual behavior: Secondary | ICD-10-CM | POA: Diagnosis not present

## 2022-04-02 DIAGNOSIS — N898 Other specified noninflammatory disorders of vagina: Secondary | ICD-10-CM | POA: Diagnosis not present

## 2022-04-08 DIAGNOSIS — Z0130 Encounter for examination of blood pressure without abnormal findings: Secondary | ICD-10-CM | POA: Diagnosis not present

## 2022-04-08 DIAGNOSIS — Z1389 Encounter for screening for other disorder: Secondary | ICD-10-CM | POA: Diagnosis not present

## 2022-04-08 DIAGNOSIS — H66001 Acute suppurative otitis media without spontaneous rupture of ear drum, right ear: Secondary | ICD-10-CM | POA: Diagnosis not present

## 2022-05-22 DIAGNOSIS — L292 Pruritus vulvae: Secondary | ICD-10-CM | POA: Diagnosis not present

## 2022-05-22 DIAGNOSIS — N760 Acute vaginitis: Secondary | ICD-10-CM | POA: Diagnosis not present

## 2022-05-22 DIAGNOSIS — Z7251 High risk heterosexual behavior: Secondary | ICD-10-CM | POA: Diagnosis not present

## 2022-06-20 ENCOUNTER — Other Ambulatory Visit: Payer: Self-pay | Admitting: Podiatry

## 2022-07-06 ENCOUNTER — Encounter: Payer: Self-pay | Admitting: Emergency Medicine

## 2022-07-06 ENCOUNTER — Emergency Department
Admission: EM | Admit: 2022-07-06 | Discharge: 2022-07-06 | Disposition: A | Discharge status: Home / Self Care | Payer: Medicaid Other | Attending: Emergency Medicine | Admitting: Emergency Medicine

## 2022-07-06 DIAGNOSIS — L509 Urticaria, unspecified: Secondary | ICD-10-CM | POA: Diagnosis not present

## 2022-07-06 DIAGNOSIS — R21 Rash and other nonspecific skin eruption: Secondary | ICD-10-CM | POA: Diagnosis present

## 2022-07-06 DIAGNOSIS — I10 Essential (primary) hypertension: Secondary | ICD-10-CM | POA: Diagnosis not present

## 2022-07-06 MED ORDER — PREDNISONE 50 MG PO TABS: ORAL_TABLET | ORAL | 0 refills | Status: AC

## 2022-07-06 MED ORDER — FAMOTIDINE 20 MG PO TABS
20.0000 mg | ORAL_TABLET | Freq: Once | ORAL | Status: AC
  Administered 2022-07-06: 20 mg via ORAL
  Filled 2022-07-06: qty 1

## 2022-07-06 MED ORDER — PREDNISONE 20 MG PO TABS
50.0000 mg | ORAL_TABLET | Freq: Once | ORAL | Status: AC
Start: 2022-07-06 — End: 2022-07-06
  Administered 2022-07-06: 50 mg via ORAL
  Filled 2022-07-06: qty 3

## 2022-07-06 NOTE — Discharge Instructions (Signed)
The medication as prescribed.  Please return for any new, worsening, or change in symptoms or other concerns.  It was a pleasure caring for you today.

## 2022-07-06 NOTE — ED Provider Notes (Signed)
[facility] Provider Note    Event Date/Time   First MD Initiated Contact with Patient 07/06/22 1640     (approximate)   History   Rash   HPI  Mary Sherman is a 37 y.o. female who presents today for evaluation of hives to her bilateral arms, and lower abdomen.  She reports that she used a new soap and symptoms began shortly after.  She reports that this has happened to her in the past.  She denies tongue or lip swelling or trouble breathing.  She does not have any abdominal pain, nausea, vomiting, diarrhea.  She did not take anything for her symptoms.  Patient Active Problem List   Diagnosis Date Noted   Antepartum hypertension 08/19/2016   History of maternal deep vein thrombosis (DVT) 08/19/2016   Obesity affecting pregnancy 08/19/2016   Fibroid 08/19/2016   First trimester screening    Sickle cell trait [facility])           Physical Exam   Triage Vital Signs: ED Triage Vitals  Enc Vitals Group     BP 07/06/22 1638 (!) 151/91     Pulse Rate 07/06/22 1638 85     Resp 07/06/22 1638 18     Temp 07/06/22 1638 97.7 F (36.5 C)     Temp Source 07/06/22 1638 Oral     SpO2 07/06/22 1638 97 %     Weight 07/06/22 1637 (!) 339 lb 8.1 oz (154 kg)     Height 07/06/22 1637 '5\' 4"'$  (1.626 m)     Head Circumference --      Peak Flow --      Pain Score 07/06/22 1637 0     Pain Loc --      Pain Edu? --      Excl. in [place]? --     Most recent vital signs: Vitals:   07/06/22 1638  BP: (!) 151/91  Pulse: 85  Resp: 18  Temp: 97.7 F (36.5 C)  SpO2: 97%    Physical Exam Vitals and nursing note reviewed.  Constitutional:      General: Awake and alert. No acute distress.    Appearance: Normal appearance. The patient is obese.  HENT:     Head: Normocephalic and atraumatic.     Mouth: Mucous membranes are moist.  No tongue or lip swelling Eyes:     General: PERRL. Normal EOMs        Right eye: No discharge.        Left eye: No discharge.      Conjunctiva/sclera: Conjunctivae normal.  Cardiovascular:     Rate and Rhythm: Normal rate and regular rhythm.     Pulses: Normal pulses.  Pulmonary:     Effort: Pulmonary effort is normal. No respiratory distress.     Breath sounds: Normal breath sounds.  Abdominal:     Abdomen is soft. There is no abdominal tenderness. No rebound or guarding. No distention. Musculoskeletal:        General: No swelling. Normal range of motion.     Cervical back: Normal range of motion and neck supple.  Skin:    General: Skin is warm and dry.     Capillary Refill: Capillary refill takes less than 2 seconds.     Findings: Diffuse urticaria noted to her bilateral forearms, and lower abdomen.  No vesicles, skin sloughing, or open wounds noted.  No excoriations.  No involvement of palms or soles Neurological:  Mental Status: The patient is awake and alert.      ED Results / Procedures / Treatments   Labs (all labs ordered are listed, but only abnormal results are displayed) Labs Reviewed - No data to display   EKG     RADIOLOGY     PROCEDURES:  Critical Care performed:   Procedures   MEDICATIONS ORDERED IN ED: Medications  predniSONE (DELTASONE) tablet 50 mg (50 mg Oral Given 07/06/22 1658)  famotidine (PEPCID) tablet 20 mg (20 mg Oral Given 07/06/22 1658)     IMPRESSION / MDM / ASSESSMENT AND PLAN / ED COURSE  I reviewed the triage vital signs and the nursing notes.   Differential diagnosis includes, but is not limited to, urticaria, contact dermatitis.  No linear excoriations to suggest scabies.  No involvement of the palms or soles.  No vesicles, bullae, skin sloughing, or mucosal involvement to suggest SJS/TENS.  No tongue or lip swelling to suggest angioedema.  No difficulty breathing or swallowing, no wheezing, no abdominal pain, nausea, vomiting, diarrhea to suggest anaphylaxis.  Patient was treated symptomatically with prednisone and Pepcid with improvement of her symptoms.   No Benadryl was given given that she has an allergy to Benadryl.  She was given prednisone burst.  She denies history of diabetes.  We discussed return precautions and the importance of close outpatient follow-up.  Also discussed discontinuation of her new soap as this may be the offending agent.  Patient understands and agrees with plan.  She was discharged in stable condition.  Patient's presentation is most consistent with acute complicated illness / injury requiring diagnostic workup.    FINAL CLINICAL IMPRESSION(S) / ED DIAGNOSES   Final diagnoses:  Hives     Rx / DC Orders   ED Discharge Orders          Ordered    predniSONE (DELTASONE) 50 MG tablet        07/06/22 1648             Note:  This document was prepared using Dragon voice recognition software and may include unintentional dictation errors.   Emeline Gins 07/06/22 1815    Harvest Dark, MD 07/06/22 2303

## 2022-07-06 NOTE — ED Triage Notes (Signed)
Pt reports itching and rash to whole body starting today. Denies any changes with detergent or lotion. States she is allergic to benadryl.

## 2022-07-29 DIAGNOSIS — N898 Other specified noninflammatory disorders of vagina: Secondary | ICD-10-CM | POA: Diagnosis not present

## 2022-08-06 ENCOUNTER — Ambulatory Visit: Payer: 59 | Admitting: Podiatry

## 2022-08-17 DIAGNOSIS — E669 Obesity, unspecified: Secondary | ICD-10-CM | POA: Diagnosis not present

## 2022-08-17 DIAGNOSIS — M4807 Spinal stenosis, lumbosacral region: Secondary | ICD-10-CM | POA: Diagnosis not present

## 2022-08-17 DIAGNOSIS — M533 Sacrococcygeal disorders, not elsewhere classified: Secondary | ICD-10-CM | POA: Diagnosis not present

## 2022-08-17 DIAGNOSIS — M545 Low back pain, unspecified: Secondary | ICD-10-CM | POA: Diagnosis not present

## 2022-08-17 DIAGNOSIS — Z86718 Personal history of other venous thrombosis and embolism: Secondary | ICD-10-CM | POA: Diagnosis not present

## 2022-08-17 DIAGNOSIS — Z87891 Personal history of nicotine dependence: Secondary | ICD-10-CM | POA: Diagnosis not present

## 2022-08-17 DIAGNOSIS — M549 Dorsalgia, unspecified: Secondary | ICD-10-CM | POA: Diagnosis not present

## 2022-08-17 DIAGNOSIS — M47814 Spondylosis without myelopathy or radiculopathy, thoracic region: Secondary | ICD-10-CM | POA: Diagnosis not present

## 2022-08-17 DIAGNOSIS — S39012A Strain of muscle, fascia and tendon of lower back, initial encounter: Secondary | ICD-10-CM | POA: Diagnosis not present

## 2022-08-17 DIAGNOSIS — M47817 Spondylosis without myelopathy or radiculopathy, lumbosacral region: Secondary | ICD-10-CM | POA: Diagnosis not present

## 2022-08-17 DIAGNOSIS — M542 Cervicalgia: Secondary | ICD-10-CM | POA: Diagnosis not present

## 2022-08-17 DIAGNOSIS — S134XXA Sprain of ligaments of cervical spine, initial encounter: Secondary | ICD-10-CM | POA: Diagnosis not present

## 2022-08-17 DIAGNOSIS — M5136 Other intervertebral disc degeneration, lumbar region: Secondary | ICD-10-CM | POA: Diagnosis not present

## 2022-08-17 DIAGNOSIS — I10 Essential (primary) hypertension: Secondary | ICD-10-CM | POA: Diagnosis not present

## 2022-08-17 DIAGNOSIS — Z041 Encounter for examination and observation following transport accident: Secondary | ICD-10-CM | POA: Diagnosis not present

## 2022-08-18 DIAGNOSIS — S161XXD Strain of muscle, fascia and tendon at neck level, subsequent encounter: Secondary | ICD-10-CM | POA: Diagnosis not present

## 2022-08-18 DIAGNOSIS — Z1389 Encounter for screening for other disorder: Secondary | ICD-10-CM | POA: Diagnosis not present

## 2022-08-18 DIAGNOSIS — Z1331 Encounter for screening for depression: Secondary | ICD-10-CM | POA: Diagnosis not present

## 2022-08-18 DIAGNOSIS — S39012D Strain of muscle, fascia and tendon of lower back, subsequent encounter: Secondary | ICD-10-CM | POA: Diagnosis not present

## 2022-08-28 DIAGNOSIS — N760 Acute vaginitis: Secondary | ICD-10-CM | POA: Diagnosis not present

## 2022-08-28 DIAGNOSIS — N898 Other specified noninflammatory disorders of vagina: Secondary | ICD-10-CM | POA: Diagnosis not present

## 2022-09-04 DIAGNOSIS — N898 Other specified noninflammatory disorders of vagina: Secondary | ICD-10-CM | POA: Diagnosis not present

## 2022-09-14 ENCOUNTER — Ambulatory Visit (INDEPENDENT_AMBULATORY_CARE_PROVIDER_SITE_OTHER): Payer: 59 | Admitting: Podiatry

## 2022-09-14 DIAGNOSIS — Z79899 Other long term (current) drug therapy: Secondary | ICD-10-CM

## 2022-09-14 MED ORDER — TERBINAFINE HCL 250 MG PO TABS: 250.0000 mg | ORAL_TABLET | Freq: Every day | ORAL | 0 refills | Status: AC

## 2022-09-14 NOTE — Progress Notes (Signed)
   Chief Complaint  Patient presents with   Nail Problem    Patient came in today for nail fungus follow-up, patient denies any pain, nails are thick and Dark, patient states that the nail are doing better with the Lamisil, patient needs a refill today     Subjective: 37 y.o. female presenting today for follow-up evaluation is fungal nail infection of the bilateral toes.  She says that the pills helped significantly.  She is beginning to notice some nail regrowth and healthy appearance of the nails.  She tolerated the oral Lamisil fine.  No new complaints  Past Medical History:  Diagnosis Date   DVT (deep venous thrombosis) (HCC)    Herpes genitalia    no current outbreaks   HSV (herpes simplex virus) infection    Hypertension 2014   Obesity affecting pregnancy     Past Surgical History:  Procedure Laterality Date   CHOLECYSTECTOMY  2007    Allergies  Allergen Reactions   Benadryl [Diphenhydramine] Hives    Objective: Physical Exam General: The patient is alert and oriented x3 in no acute distress.  Dermatology: The base of the nails actually appear to be growing out routinely with healthy nail growth.  The remaining distal portion of the the nail continues to be hyperkeratotic, discolored, thickened, onychodystrophy noted bilateral feet. Skin is warm, dry and supple bilateral lower extremities. Negative for open lesions or macerations.  Vascular: Palpable pedal pulses bilaterally. No edema or erythema noted. Capillary refill within normal limits.  Neurological: Epicritic and protective threshold grossly intact bilaterally.   Musculoskeletal Exam: No pedal deformity noted  Assessment: #1 Onychomycosis of toenails; improved -Patient evaluated.  Overall there is significant improvement of the discoloration of the nails.  They appear to be growing out routinely and the oral Lamisil was effective -Patient tolerated the oral Lamisil.  Another round prescribed.  Prescription  for Lamisil to 50 mg #90. -Updated order for new hepatic function panel.  If abnormal discontinue the oral Lamisil -Patient states that she is moving next month to [place].  Return to clinic as needed    Felecia Shelling, DPM [facility]  Dr. Felecia Shelling, DPM    [address] [place], [place] [address]                Office [phone]  Fax [phone]

## 2022-09-15 LAB — HEPATIC FUNCTION PANEL
ALT: 12 IU/L (ref 0–32)
AST: 16 IU/L (ref 0–40)
Albumin: 3.7 g/dL — ABNORMAL LOW (ref 3.9–4.9)
Alkaline Phosphatase: 59 IU/L (ref 44–121)
Bilirubin Total: <0.2 mg/dL (ref 0.0–1.2)
Bilirubin, Direct: <0.1 mg/dL (ref 0.00–0.40)
Total Protein: 6 g/dL (ref 6.0–8.5)

## 2022-09-20 ENCOUNTER — Telehealth: Payer: Self-pay | Admitting: Podiatry

## 2022-09-20 NOTE — Telephone Encounter (Signed)
Spoke with patient. Reviewed LFT. Continue Lamisil 250mg  daily as prescribed.   Felecia Shelling, DPM [facility]  Dr. Felecia Shelling, DPM    [address] [place], [place] [address]                Office [phone]  Fax [phone]

## 2022-10-27 DIAGNOSIS — Z3202 Encounter for pregnancy test, result negative: Secondary | ICD-10-CM | POA: Diagnosis not present

## 2022-10-27 DIAGNOSIS — N760 Acute vaginitis: Secondary | ICD-10-CM | POA: Diagnosis not present

## 2022-10-27 DIAGNOSIS — B9689 Other specified bacterial agents as the cause of diseases classified elsewhere: Secondary | ICD-10-CM | POA: Diagnosis not present

## 2022-11-18 DIAGNOSIS — N760 Acute vaginitis: Secondary | ICD-10-CM | POA: Diagnosis not present

## 2022-12-26 ENCOUNTER — Encounter: Payer: Self-pay | Admitting: Emergency Medicine

## 2022-12-26 ENCOUNTER — Emergency Department
Admission: EM | Admit: 2022-12-26 | Discharge: 2022-12-26 | Disposition: A | Discharge status: Home / Self Care | Payer: 59 | Attending: Emergency Medicine | Admitting: Emergency Medicine

## 2022-12-26 ENCOUNTER — Other Ambulatory Visit: Payer: Self-pay

## 2022-12-26 ENCOUNTER — Emergency Department: Payer: 59

## 2022-12-26 DIAGNOSIS — Z79899 Other long term (current) drug therapy: Secondary | ICD-10-CM | POA: Diagnosis not present

## 2022-12-26 DIAGNOSIS — S6991XA Unspecified injury of right wrist, hand and finger(s), initial encounter: Secondary | ICD-10-CM | POA: Diagnosis not present

## 2022-12-26 DIAGNOSIS — X58XXXA Exposure to other specified factors, initial encounter: Secondary | ICD-10-CM | POA: Insufficient documentation

## 2022-12-26 DIAGNOSIS — S60041A Contusion of right ring finger without damage to nail, initial encounter: Secondary | ICD-10-CM

## 2022-12-26 DIAGNOSIS — M79644 Pain in right finger(s): Secondary | ICD-10-CM | POA: Diagnosis not present

## 2022-12-26 DIAGNOSIS — M7989 Other specified soft tissue disorders: Secondary | ICD-10-CM | POA: Diagnosis not present

## 2022-12-26 DIAGNOSIS — I10 Essential (primary) hypertension: Secondary | ICD-10-CM | POA: Insufficient documentation

## 2022-12-26 MED ORDER — IBUPROFEN 800 MG PO TABS
800.0000 mg | ORAL_TABLET | Freq: Once | ORAL | Status: AC
  Administered 2022-12-26: 800 mg via ORAL
  Filled 2022-12-26: qty 1

## 2022-12-26 NOTE — Discharge Instructions (Signed)
You may take Ibuprofen as needed for pain.  Wear splint as needed for comfort.  Return to the ER for worsening symptoms or other concerns.

## 2022-12-26 NOTE — ED Provider Notes (Signed)
[facility] Provider Note    Event Date/Time   First MD Initiated Contact with Patient 12/26/22 0153     (approximate)   History   Finger Injury   HPI  Mary Sherman is a 37 y.o. female who presents to the ED from home with a chief complaint of right ring finger pain and swelling.  Patient reports she was playing with her children earlier and subsequently noted the swelling with pain.  Denies fever, redness, possible insect bite.  Voices no other complaints or injuries.     Past Medical History   Past Medical History:  Diagnosis Date   DVT (deep venous thrombosis) (HCC)    Herpes genitalia    no current outbreaks   HSV (herpes simplex virus) infection    Hypertension 2014   Obesity affecting pregnancy      Active Problem List   Patient Active Problem List   Diagnosis Date Noted   Antepartum hypertension 08/19/2016   History of maternal deep vein thrombosis (DVT) 08/19/2016   Obesity affecting pregnancy 08/19/2016   Fibroid 08/19/2016   First trimester screening    Sickle cell trait (HCC)      Past Surgical History   Past Surgical History:  Procedure Laterality Date   CHOLECYSTECTOMY  2007     Home Medications   Prior to Admission medications   Medication Sig Start Date End Date Taking? Authorizing Provider  amoxicillin (AMOXIL) 875 MG tablet Take 1 tablet (875 mg total) by mouth 2 (two) times daily. 03/30/22   Fisher, Roselyn Bering, PA-C  chlorothiazide (DIURIL) 250 MG tablet Take 250 mg by mouth every morning. Reported on 09/04/2015    [provider]  metoprolol tartrate (LOPRESSOR) 100 MG tablet Take 1 tablet (100 mg total) by mouth once for 1 dose. Please take one time dose 100mg  metoprolol tartrate 2 hr prior to cardiac CT for HR control IF HR >55bpm. 01/08/22 01/08/22  Debbe Odea, MD  predniSONE (DELTASONE) 10 MG tablet Take 3 tablets (30 mg total) by mouth daily with breakfast. 03/30/22   Sherrie Mustache, Roselyn Bering, PA-C   predniSONE (DELTASONE) 50 MG tablet Take 1 tablet by mouth for the next 4 days starting on 07/07/2022 07/06/22   Poggi, Eileen Stanford E, PA-C  terbinafine (LAMISIL) 250 MG tablet Take 1 tablet (250 mg total) by mouth daily. 09/14/22   Felecia Shelling, DPM     Allergies  Diphenhydramine   Family History   Family History  Problem Relation Age of Onset   Hypertension Mother    Hypertension Sister      Physical Exam  Triage Vital Signs: ED Triage Vitals  Encounter Vitals Group     BP 12/26/22 0125 130/82     Systolic BP Percentile --      Diastolic BP Percentile --      Pulse Rate 12/26/22 0125 81     Resp 12/26/22 0125 16     Temp 12/26/22 0125 98.4 F (36.9 C)     Temp Source 12/26/22 0125 Oral     SpO2 12/26/22 0125 95 %     Weight 12/26/22 0125 (!) 350 lb (158.8 kg)     Height 12/26/22 0125 5\' 3"  (1.6 m)     Head Circumference --      Peak Flow --      Pain Score 12/26/22 0125 9     Pain Loc --      Pain Education --      Exclude  from Growth Chart --     Updated Vital Signs: BP 129/78   Pulse 80   Temp 98.4 F (36.9 C) (Oral)   Resp 18   Ht 5\' 3"  (1.6 m)   Wt (!) 158.8 kg   SpO2 95%   BMI 62.00 kg/m    General: Awake, no distress.  CV:  Good peripheral perfusion.  Resp:  Normal effort.  Abd:  No distention.  Other:  Right ring finger with mild swelling and tender to palpation.  There is no warmth, erythema or fluctuance.  Decreased range of motion secondary to pain.  2+ radial pulse.  Brisk, less than 5-second capillary refill.   ED Results / Procedures / Treatments  Labs (all labs ordered are listed, but only abnormal results are displayed) Labs Reviewed - No data to display   EKG  None   RADIOLOGY I have independently visualized and interpreted patient's x-ray as well as noted the radiology interpretation:  Right finger x-ray: Negative for acute osseous abnormality, soft tissue swelling  Official radiology report(s): DG Finger Ring Right  Result  Date: 12/26/2022 CLINICAL DATA:  Pain and swelling in the right ring finger. Unknown injury history. EXAM: RIGHT RING FINGER 2+V COMPARISON:  None Available. FINDINGS: There is no evidence of fracture or dislocation. There is no evidence of arthropathy or other focal bone abnormality. There is mild soft tissue swelling. IMPRESSION: Soft tissue swelling without evidence of fractures or other acute underlying bone abnormality. Electronically Signed   By: Almira Bar M.D.   On: 12/26/2022 01:59     PROCEDURES:  Critical Care performed: No  Procedures   MEDICATIONS ORDERED IN ED: Medications  ibuprofen (ADVIL) tablet 800 mg (800 mg Oral Given 12/26/22 0228)     IMPRESSION / MDM / ASSESSMENT AND PLAN / ED COURSE  I reviewed the triage vital signs and the nursing notes.                             37 year old female presenting with pain and swelling to right fourth digit.  Administer NSAIDs, placed finger splint and patient will follow-up with orthopedics as needed.  Strict return precautions given.  Patient verbalizes understanding and agrees with plan of care.  Patient's presentation is most consistent with acute, uncomplicated illness.   FINAL CLINICAL IMPRESSION(S) / ED DIAGNOSES   Final diagnoses:  Contusion of right ring finger without damage to nail, initial encounter     Rx / DC Orders   ED Discharge Orders     None        Note:  This document was prepared using Dragon voice recognition software and may include unintentional dictation errors.   Irean Hong, MD 12/26/22 [phone]

## 2022-12-26 NOTE — ED Triage Notes (Signed)
Pt arrived via POV with c/o R ring finger pain and swelling, unsure if injured or not, pt states she was playing with her children earlier today and noticed the pain.  Decreased ROM to finger noted.

## 2022-12-31 DIAGNOSIS — N760 Acute vaginitis: Secondary | ICD-10-CM | POA: Diagnosis not present

## 2022-12-31 DIAGNOSIS — N898 Other specified noninflammatory disorders of vagina: Secondary | ICD-10-CM | POA: Diagnosis not present

## 2023-04-09 DIAGNOSIS — W57XXXA Bitten or stung by nonvenomous insect and other nonvenomous arthropods, initial encounter: Secondary | ICD-10-CM | POA: Diagnosis not present

## 2023-04-09 DIAGNOSIS — S40861A Insect bite (nonvenomous) of right upper arm, initial encounter: Secondary | ICD-10-CM | POA: Diagnosis not present

## 2023-04-09 DIAGNOSIS — R519 Headache, unspecified: Secondary | ICD-10-CM | POA: Diagnosis not present

## 2023-04-09 DIAGNOSIS — I10 Essential (primary) hypertension: Secondary | ICD-10-CM | POA: Diagnosis not present

## 2023-04-24 DIAGNOSIS — Z79899 Other long term (current) drug therapy: Secondary | ICD-10-CM | POA: Diagnosis not present

## 2023-04-24 DIAGNOSIS — M170 Bilateral primary osteoarthritis of knee: Secondary | ICD-10-CM | POA: Diagnosis not present

## 2023-04-24 DIAGNOSIS — I10 Essential (primary) hypertension: Secondary | ICD-10-CM | POA: Diagnosis not present

## 2023-04-24 DIAGNOSIS — Z6841 Body Mass Index (BMI) 40.0 and over, adult: Secondary | ICD-10-CM | POA: Diagnosis not present

## 2023-04-24 DIAGNOSIS — M25569 Pain in unspecified knee: Secondary | ICD-10-CM | POA: Diagnosis not present
